# Patient Record
Sex: Female | Born: 1990 | Race: White | Hispanic: No | Marital: Single | Smoking: Current every day smoker
Health system: Southern US, Community
[De-identification: ages and names within clinical notes are randomized; demographics above are authoritative.]

## PROBLEM LIST (undated history)

## (undated) DIAGNOSIS — Z3490 Encounter for supervision of normal pregnancy, unspecified, unspecified trimester: Secondary | ICD-10-CM

## (undated) DIAGNOSIS — G8929 Other chronic pain: Secondary | ICD-10-CM

---
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
## 2011-02-16 ENCOUNTER — Emergency Department (HOSPITAL_COMMUNITY)
Admission: EM | Admit: 2011-02-16 | Discharge: 2011-02-17 | Disposition: A | Discharge status: Home / Self Care | Payer: Medicaid Other | Attending: Emergency Medicine | Admitting: Emergency Medicine

## 2011-02-16 DIAGNOSIS — R1031 Right lower quadrant pain: Secondary | ICD-10-CM | POA: Insufficient documentation

## 2011-02-16 DIAGNOSIS — R109 Unspecified abdominal pain: Secondary | ICD-10-CM | POA: Insufficient documentation

## 2011-02-16 DIAGNOSIS — N12 Tubulo-interstitial nephritis, not specified as acute or chronic: Secondary | ICD-10-CM | POA: Insufficient documentation

## 2011-02-16 DIAGNOSIS — R300 Dysuria: Secondary | ICD-10-CM | POA: Insufficient documentation

## 2011-02-16 DIAGNOSIS — R110 Nausea: Secondary | ICD-10-CM | POA: Insufficient documentation

## 2011-02-16 LAB — CBC
HCT: 38 % (ref 36.0–46.0)
Hemoglobin: 13.1 g/dL (ref 12.0–15.0)
MCH: 31.2 pg (ref 26.0–34.0)
MCHC: 34.5 g/dL (ref 30.0–36.0)
MCV: 90.5 fL (ref 78.0–100.0)
Platelets: 381 10*3/uL (ref 150–400)
RBC: 4.2 MIL/uL (ref 3.87–5.11)
RDW: 12.9 % (ref 11.5–15.5)
WBC: 26 10*3/uL — ABNORMAL HIGH (ref 4.0–10.5)

## 2011-02-16 LAB — POCT I-STAT, CHEM 8
BUN: 5 mg/dL — ABNORMAL LOW (ref 6–23)
Calcium, Ion: 1.15 mmol/L (ref 1.12–1.32)
Chloride: 97 mEq/L (ref 96–112)
Creatinine, Ser: 0.9 mg/dL (ref 0.50–1.10)
Glucose, Bld: 100 mg/dL — ABNORMAL HIGH (ref 70–99)
HCT: 41 % (ref 36.0–46.0)
Hemoglobin: 13.9 g/dL (ref 12.0–15.0)
Potassium: 3.2 mEq/L — ABNORMAL LOW (ref 3.5–5.1)
Sodium: 136 mEq/L (ref 135–145)
TCO2: 27 mmol/L (ref 0–100)

## 2011-02-16 LAB — DIFFERENTIAL
Basophils Absolute: 0 10*3/uL (ref 0.0–0.1)
Basophils Relative: 0 % (ref 0–1)
Eosinophils Absolute: 0.1 10*3/uL (ref 0.0–0.7)
Eosinophils Relative: 0 % (ref 0–5)
Lymphocytes Relative: 14 % (ref 12–46)
Lymphs Abs: 3.7 10*3/uL (ref 0.7–4.0)
Monocytes Absolute: 2.5 10*3/uL — ABNORMAL HIGH (ref 0.1–1.0)
Monocytes Relative: 10 % (ref 3–12)
Neutro Abs: 19.8 10*3/uL — ABNORMAL HIGH (ref 1.7–7.7)
Neutrophils Relative %: 76 % (ref 43–77)

## 2011-02-16 LAB — URINALYSIS, ROUTINE W REFLEX MICROSCOPIC
Bilirubin Urine: NEGATIVE
Glucose, UA: NEGATIVE mg/dL
Ketones, ur: NEGATIVE mg/dL
Nitrite: POSITIVE — AB
Protein, ur: 30 mg/dL — AB
Specific Gravity, Urine: 1.014 (ref 1.005–1.030)
Urobilinogen, UA: 4 mg/dL — ABNORMAL HIGH (ref 0.0–1.0)
pH: 6 (ref 5.0–8.0)

## 2011-02-16 LAB — URINE MICROSCOPIC-ADD ON

## 2011-02-16 LAB — POCT PREGNANCY, URINE: Preg Test, Ur: NEGATIVE

## 2011-02-17 ENCOUNTER — Emergency Department (HOSPITAL_COMMUNITY): Payer: Medicaid Other

## 2011-02-17 LAB — URINALYSIS, ROUTINE W REFLEX MICROSCOPIC
Glucose, UA: NEGATIVE mg/dL
Ketones, ur: NEGATIVE mg/dL
Nitrite: POSITIVE — AB
Protein, ur: 30 mg/dL — AB
Specific Gravity, Urine: 1.019 (ref 1.005–1.030)
Urobilinogen, UA: 4 mg/dL — ABNORMAL HIGH (ref 0.0–1.0)
pH: 6 (ref 5.0–8.0)

## 2011-02-17 LAB — WET PREP, GENITAL
Trich, Wet Prep: NONE SEEN
Yeast Wet Prep HPF POC: NONE SEEN

## 2011-02-17 LAB — URINE MICROSCOPIC-ADD ON

## 2011-02-19 LAB — GC/CHLAMYDIA PROBE AMP, GENITAL
Chlamydia, DNA Probe: NEGATIVE
GC Probe Amp, Genital: NEGATIVE

## 2012-02-14 ENCOUNTER — Emergency Department (HOSPITAL_COMMUNITY)
Admission: EM | Admit: 2012-02-14 | Discharge: 2012-02-14 | Discharge status: Against Medical Advice | Payer: Medicaid Other | Attending: Emergency Medicine | Admitting: Emergency Medicine

## 2012-02-14 ENCOUNTER — Encounter (HOSPITAL_COMMUNITY): Payer: Self-pay | Admitting: *Deleted

## 2012-02-14 DIAGNOSIS — Z0389 Encounter for observation for other suspected diseases and conditions ruled out: Secondary | ICD-10-CM | POA: Insufficient documentation

## 2012-02-14 HISTORY — DX: Encounter for supervision of normal pregnancy, unspecified, unspecified trimester: Z34.90

## 2012-02-14 NOTE — ED Notes (Addendum)
No answer in all waiting areas x 1 

## 2012-02-14 NOTE — ED Notes (Signed)
Pt is [redacted] weeks pregnant,  No bleeding. Low abd pain for 1 hour.  Vomiting since this am.

## 2012-02-14 NOTE — ED Notes (Signed)
No answer in waiting areas, called x 3 times

## 2013-12-27 ENCOUNTER — Encounter (HOSPITAL_COMMUNITY): Payer: Self-pay | Admitting: Emergency Medicine

## 2013-12-27 ENCOUNTER — Emergency Department (HOSPITAL_COMMUNITY)
Admission: EM | Admit: 2013-12-27 | Discharge: 2013-12-27 | Disposition: A | Discharge status: Home / Self Care | Payer: Medicaid Other | Attending: Emergency Medicine | Admitting: Emergency Medicine

## 2013-12-27 ENCOUNTER — Emergency Department (HOSPITAL_COMMUNITY): Payer: Medicaid Other

## 2013-12-27 DIAGNOSIS — R0789 Other chest pain: Secondary | ICD-10-CM

## 2013-12-27 DIAGNOSIS — R071 Chest pain on breathing: Secondary | ICD-10-CM | POA: Insufficient documentation

## 2013-12-27 DIAGNOSIS — Z880 Allergy status to penicillin: Secondary | ICD-10-CM | POA: Insufficient documentation

## 2013-12-27 DIAGNOSIS — F17200 Nicotine dependence, unspecified, uncomplicated: Secondary | ICD-10-CM | POA: Insufficient documentation

## 2013-12-27 MED ORDER — NAPROXEN 375 MG PO TABS
375.0000 mg | ORAL_TABLET | Freq: Two times a day (BID) | ORAL | Status: DC
Start: 2013-12-27 — End: 2015-02-17

## 2013-12-27 MED ORDER — CYCLOBENZAPRINE HCL 5 MG PO TABS: 5.0000 mg | ORAL_TABLET | Freq: Three times a day (TID) | ORAL | Status: DC | PRN

## 2013-12-27 NOTE — ED Provider Notes (Signed)
CSN: [id number]     Arrival date & time 12/27/13  2006 History   First MD Initiated Contact with Patient 12/27/13 2050     Chief Complaint  Patient presents with  . Neck Pain     (Consider location/radiation/quality/duration/timing/severity/associated sxs/prior Treatment) The history is provided by the patient.  Penny Douglas is a 23 y.o. female who presents to the ED with pain to the right upper chest wall that started a couple days ago. She states that there was a knot that has gone down some now but continues to be tender when anything touches it. She does not remember any injury to the area.    Past Medical History  Diagnosis Date  . Pregnant    History reviewed. No pertinent past surgical history. History reviewed. No pertinent family history. History  Substance Use Topics  . Smoking status: Current Every Day Smoker  . Smokeless tobacco: Not on file  . Alcohol Use: No   OB History   Grav Para Term Preterm Abortions TAB SAB Ect Mult Living   1              Review of Systems Chest wall pain.  All other systems negative   Allergies  Amoxicillin and Penicillins  Home Medications   Prior to Admission medications   Medication Sig Start Date End Date Taking? Authorizing Marcella Dunnaway  ibuprofen (ADVIL,MOTRIN) 200 MG tablet Take 200 mg by mouth every 6 (six) hours as needed.   Yes Historical Kelcy Baeten, MD   BP 104/68  Pulse 103  Temp(Src) 97.9 F (36.6 C)  Resp 20  Ht 5\' 3"  (1.6 m)  Wt 115 lb (52.164 kg)  BMI 20.38 kg/m2  SpO2 100%  LMP 11/26/2012  Breastfeeding? Unknown Physical Exam  Nursing note and vitals reviewed. Constitutional: She is oriented to person, place, and time. She appears well-developed and well-nourished. No distress.  HENT:  Head: Normocephalic.  Eyes: Conjunctivae and EOM are normal.  Neck: Neck supple.  Cardiovascular: Normal rate and regular rhythm.   Pulmonary/Chest: Effort normal and breath sounds normal. She exhibits tenderness. She  exhibits no mass, no crepitus, no deformity and no swelling.    Musculoskeletal: Normal range of motion.  Pain with palpation of right upper chest wall.   Neurological: She is alert and oriented to person, place, and time. No cranial nerve deficit.  Skin: Skin is warm and dry.  Psychiatric: She has a normal mood and affect. Her behavior is normal.    ED Course  Procedures Pain management, x-ray.  Dg Chest 2 View  12/27/2013   CLINICAL DATA:  Pain to upper anterior chest area.  EXAM: CHEST  2 VIEW  COMPARISON:  02/07/2011  FINDINGS: The heart size and mediastinal contours are within normal limits. Both lungs are clear. The visualized skeletal structures are unremarkable.  IMPRESSION: No active cardiopulmonary disease.   Electronically Signed   By: Signa Kellaylor  Stroud M.D.   On: 12/27/2013 22:11    MDM  23 y.o. female with pain to the right chest wall that has been ongoing for the past 2 days. Patient feeling better after pain medication. Sleeping in exam room. Stable for discharge without cardiac symptoms. Will treat for chest wall pain. She will return for worsening symptoms.    Medication List    TAKE these medications       cyclobenzaprine 5 MG tablet  Commonly known as:  FLEXERIL  Take 1 tablet (5 mg total) by mouth 3 (three) times daily as needed  for muscle spasms.     naproxen 375 MG tablet  Commonly known as:  NAPROSYN  Take 1 tablet (375 mg total) by mouth 2 (two) times daily.      ASK your doctor about these medications       ibuprofen 200 MG tablet  Commonly known as:  ADVIL,MOTRIN  Take 200 mg by mouth every 6 (six) hours as needed.         [facility] Orlene OchM [place], [place] 12/28/13 1536

## 2013-12-27 NOTE — ED Notes (Signed)
Bump to lower neck upper chest on left side, painful to touch.

## 2013-12-27 NOTE — Discharge Instructions (Signed)
Chest Wall Pain °Chest wall pain is pain felt in or around the chest bones and muscles. It may take up to 6 weeks to get better. It may take longer if you are active. Chest wall pain can happen on its own. Other times, things like germs, injury, coughing, or exercise can cause the pain. °HOME CARE  °· Avoid activities that make you tired or cause pain. Try not to use your chest, belly (abdominal), or side muscles. Do not use heavy weights. °· Put ice on the sore area. °¨ Put ice in a plastic bag. °¨ Place a towel between your skin and the bag. °¨ Leave the ice on for 15-20 minutes for the first 2 days. °· Only take medicine as told by your doctor. °GET HELP RIGHT AWAY IF:  °· You have more pain or are very uncomfortable. °· You have a fever. °· Your chest pain gets worse. °· You have new problems. °· You feel sick to your stomach (nauseous) or throw up (vomit). °· You start to sweat or feel lightheaded. °· You have a cough with mucus (phlegm). °· You cough up blood. °MAKE SURE YOU:  °· Understand these instructions. °· Will watch your condition. °· Will get help right away if you are not doing well or get worse. °Document Released: 12/05/2007 Document Revised: 09/10/2011 Document Reviewed: 02/12/2011 °Exit[employer]® Patient Information ©2015 [employer]. This information is not intended to replace advice given to you by your health care provider. Make sure you discuss any questions you have with your health care provider. ° °

## 2013-12-28 NOTE — ED Provider Notes (Signed)
Medical screening examination/treatment/procedure(s) were performed by non-physician practitioner and as supervising physician I was immediately available for consultation/collaboration.   EKG Interpretation None       Flint MelterElliott L Wentz, MD 12/28/13 [phone]

## 2014-05-03 ENCOUNTER — Encounter (HOSPITAL_COMMUNITY): Payer: Self-pay | Admitting: Emergency Medicine

## 2014-12-19 ENCOUNTER — Emergency Department (HOSPITAL_COMMUNITY)
Admission: EM | Admit: 2014-12-19 | Discharge: 2014-12-19 | Disposition: A | Discharge status: Home / Self Care | Payer: Medicaid Other | Attending: Emergency Medicine | Admitting: Emergency Medicine

## 2014-12-19 ENCOUNTER — Encounter (HOSPITAL_COMMUNITY): Payer: Self-pay | Admitting: *Deleted

## 2014-12-19 DIAGNOSIS — Z720 Tobacco use: Secondary | ICD-10-CM | POA: Diagnosis not present

## 2014-12-19 DIAGNOSIS — H9202 Otalgia, left ear: Secondary | ICD-10-CM | POA: Insufficient documentation

## 2014-12-19 DIAGNOSIS — Z791 Long term (current) use of non-steroidal anti-inflammatories (NSAID): Secondary | ICD-10-CM | POA: Insufficient documentation

## 2014-12-19 DIAGNOSIS — Z880 Allergy status to penicillin: Secondary | ICD-10-CM | POA: Insufficient documentation

## 2014-12-19 DIAGNOSIS — K029 Dental caries, unspecified: Secondary | ICD-10-CM

## 2014-12-19 MED ORDER — IBUPROFEN 400 MG PO TABS
600.0000 mg | ORAL_TABLET | Freq: Once | ORAL | Status: AC
  Administered 2014-12-19: 600 mg via ORAL
  Filled 2014-12-19: qty 2

## 2014-12-19 MED ORDER — CLINDAMYCIN HCL 150 MG PO CAPS
150.0000 mg | ORAL_CAPSULE | Freq: Once | ORAL | Status: AC
  Administered 2014-12-19: 150 mg via ORAL
  Filled 2014-12-19: qty 1

## 2014-12-19 MED ORDER — CLINDAMYCIN HCL 150 MG PO CAPS: 150.0000 mg | ORAL_CAPSULE | Freq: Four times a day (QID) | ORAL | Status: DC

## 2014-12-19 MED ORDER — ACETAMINOPHEN 500 MG PO TABS
1000.0000 mg | ORAL_TABLET | Freq: Once | ORAL | Status: AC
  Administered 2014-12-19: 1000 mg via ORAL
  Filled 2014-12-19: qty 2

## 2014-12-19 NOTE — ED Notes (Signed)
Pt c/o left side earache and toothache that started two weeks ago,

## 2014-12-19 NOTE — ED Provider Notes (Signed)
CSN: [id number]     Arrival date & time 12/19/14  0010 History  This chart was scribed for Devoria Albe, MD by Doreatha Martin, ED Scribe. This patient was seen in room APA18/APA18 and the patient's care was started at 12:22 AM.     Chief Complaint  Patient presents with  . Otalgia   The history is provided by the patient. No language interpreter was used.    HPI Comments: DONNICA TABOR is a 24 y.o. female who presents to the Emergency Department complaining of moderate, constant left-sided otalgia onset 1 week ago. She states associated ear drainage onset 3 days ago, difficulty sleeping and left lower dental pain onset 2 weeks ago. Pt states that she has a cracked tooth and it hurts to eat and drink. She reports that air hitting the tooth is painful, and cold food hurts more that hot food. She has tried Oragel with moderate relief. She has also tried Benadryl and 3, 500mg  Tylenol (take 1 TID) with no relief. Pt states the last time she went to the dentist was in middle school when she got her braces off. She has no PCP She is a current smoker (0.5 ppd). Pt is not currently working but was working at [employer] previously. She denies fevers.   PCP none Dentist none  Past Medical History  Diagnosis Date  . Pregnant    History reviewed. No pertinent past surgical history. No family history on file. History  Substance Use Topics  . Smoking status: Current Every Day Smoker  . Smokeless tobacco: Not on file  . Alcohol Use: No  smokes 1/2 ppd unemployed  OB History    Gravida Para Term Preterm AB TAB SAB Ectopic Multiple Living   1              IUD   Review of Systems  Constitutional: Negative for fever.  HENT: Positive for dental problem, ear discharge and ear pain.   All other systems reviewed and are negative.  Allergies  Amoxicillin and Penicillins  Home Medications   Prior to Admission medications   Medication Sig Start Date End Date Taking? Authorizing  Provider  clindamycin (CLEOCIN) 150 MG capsule Take 1 capsule (150 mg total) by mouth every 6 (six) hours. 12/19/14   Devoria Albe, MD  cyclobenzaprine (FLEXERIL) 5 MG tablet Take 1 tablet (5 mg total) by mouth 3 (three) times daily as needed for muscle spasms. 12/27/13   Hope Orlene Och, NP  ibuprofen (ADVIL,MOTRIN) 200 MG tablet Take 200 mg by mouth every 6 (six) hours as needed.    Historical Provider, MD  naproxen (NAPROSYN) 375 MG tablet Take 1 tablet (375 mg total) by mouth 2 (two) times daily. 12/27/13   Hope Orlene Och, NP   Triage VS: BP 100/63 mmHg  Pulse 65  Temp(Src) 97.9 F (36.6 C) (Oral)  Resp 20  Ht 5\' 2"  (1.575 m)  Wt 115 lb (52.164 kg)  BMI 21.03 kg/m2  SpO2 100%  Vital signs normal   Physical Exam  Constitutional: She is oriented to person, place, and time. She appears well-developed and well-nourished.  Non-toxic appearance. She does not appear ill. No distress.  HENT:  Head: Normocephalic and atraumatic.  Right Ear: Tympanic membrane, external ear and ear canal normal.  Left Ear: Tympanic membrane, external ear and ear canal normal.  Nose: Nose normal. No mucosal edema or rhinorrhea.  Mouth/Throat: Oropharynx is clear and moist and mucous membranes are normal. No dental abscesses  or uvula swelling.  Extensive dental caries along the lower gumline of all her bottom teeth. No focal swelling of the gums or abscess seen.    She c/o pain when I insert the speculum in her left ear canal but there is no swelling of the canal or redness and no drainage, the TM is intact.   Eyes: Conjunctivae and EOM are normal. Pupils are equal, round, and reactive to light.  Neck: Normal range of motion and full passive range of motion without pain. Neck supple.  Pulmonary/Chest: Effort normal. No respiratory distress. She has no rhonchi. She exhibits no crepitus.  Abdominal: Normal appearance.  Musculoskeletal: Normal range of motion.  Moves all extremities well.   Neurological: She is alert and  oriented to person, place, and time. She has normal strength. No cranial nerve deficit.  Skin: Skin is warm, dry and intact. No rash noted. No erythema. No pallor.  Psychiatric: She has a normal mood and affect. Her speech is normal and behavior is normal. Her mood appears not anxious.  Nursing note and vitals reviewed.   ED Course  Procedures (including critical care time) Medications  clindamycin (CLEOCIN) capsule 150 mg (150 mg Oral Given 12/19/14 0047)  acetaminophen (TYLENOL) tablet 1,000 mg (1,000 mg Oral Given 12/19/14 0047)  ibuprofen (ADVIL,MOTRIN) tablet 600 mg (600 mg Oral Given 12/19/14 0046)    DIAGNOSTIC STUDIES: Oxygen Saturation is 100% on RA, normal by my interpretation.    COORDINATION OF CARE: 12:27 AM Discussed treatment plan with pt at bedside and pt agreed to plan.   12:47 AM Provided resources for the health department and the free clinic. When I check Amion there is no dentist on call this weekend. Also I checked online for the [facility] free dental clinics and they do not have [place] or [facility] on their schedule for the rest of 2016. Pt used to see a dentist because she was wearing braces, her father reports she started with the dental decay when she was pregnant.   Labs Review Labs Reviewed - No data to display  Imaging Review No results found.   EKG Interpretation None     MDM   Final diagnoses:  Dental caries  Ear ache, left    Discharge Medication List as of 12/19/2014 12:42 AM    START taking these medications   Details  clindamycin (CLEOCIN) 150 MG capsule Take 1 capsule (150 mg total) by mouth every 6 (six) hours., Starting 12/19/2014, Until Discontinued, Print        Plan discharge  Devoria Albe, MD, FACEP   I personally performed the services described in this documentation, which was scribed in my presence. The recorded information has been reviewed and considered.  Devoria Albe, MD, Concha Pyo, MD 12/19/14 [id number]

## 2014-12-19 NOTE — Discharge Instructions (Signed)
You will need to see a dentist. I gave you two numbers to try for the free dental clinic in [facility]. Take ibuprofen 600 mg + acetaminophen 1000 mg 4 times a day for pain. Take the antibiotics until gone.  Return to the ED if you get a fever, otherwise you will need to see a dentist for definitive dental care.    Dental Caries Dental caries is tooth decay. This decay can cause a hole in teeth (cavity) that can get bigger and deeper over time. HOME CARE  Brush and floss your teeth. Do this at least two times a day.  Use a fluoride toothpaste.  Use a mouth rinse if told by your dentist or doctor.  Eat less sugary and starchy foods. Drink less sugary drinks.  Avoid snacking often on sugary and starchy foods. Avoid sipping often on sugary drinks.  Keep regular checkups and cleanings with your dentist.  Use fluoride supplements if told by your dentist or doctor.  Allow fluoride to be applied to teeth if told by your dentist or doctor. Document Released: 03/27/2008 Document Revised: 11/02/2013 Document Reviewed: 06/20/2012 [facility] Patient Information 2015 [place], [place]. This information is not intended to replace advice given to you by your health care provider. Make sure you discuss any questions you have with your health care provider.  Otalgia Otalgia is pain in or around the ear. When the pain is from the ear itself it is called primary otalgia. Pain may also be coming from somewhere else, like the head and neck. This is called secondary otalgia.  CAUSES  Causes of primary otalgia include:  Middle ear infection.  It can also be caused by injury to the ear or infection of the ear canal (swimmer's ear). Swimmer's ear causes pain, swelling and often drainage from the ear canal. Causes of secondary otalgia include:  Sinus infections.  Allergies and colds that cause stuffiness of the nose and tubes that drain the ears (eustachian tubes).  Dental problems like cavities, gum  infections or teething.  Sore Throat (tonsillitis and pharyngitis).  Swollen glands in the neck.  Infection of the bone behind the ear (mastoiditis).  TMJ discomfort (problems with the joint between your jaw and your skull).  Other problems such as nerve disorders, circulation problems, heart disease and tumors of the head and neck can also cause symptoms of ear pain. This is rare. DIAGNOSIS  Evaluation, Diagnosis and Testing:  Examination by your medical caregiver is recommended to evaluate and diagnose the cause of otalgia.  Further testing or referral to a specialist may be indicated if the cause of the ear pain is not found and the symptom persists. TREATMENT   Your doctor may prescribe antibiotics if an ear infection is diagnosed.  Pain relievers and topical analgesics may be recommended.  It is important to take all medications as prescribed. HOME CARE INSTRUCTIONS   It may be helpful to sleep with the painful ear in the up position.  A warm compress over the painful ear may provide relief.  A soft diet and avoiding gum may help while ear pain is present. SEEK IMMEDIATE MEDICAL CARE IF:  You develop severe pain, a high fever, repeated vomiting or dehydration.  You develop extreme dizziness, headache, confusion, ringing in the ears (tinnitus) or hearing loss. Document Released: 07/26/2004 Document Revised: 09/10/2011 Document Reviewed: 04/27/2009 [facility] Patient Information 2015 [place], [place]. This information is not intended to replace advice given to you by your health care provider. Make sure you  discuss any questions you have with your health care provider. ° °

## 2015-02-17 ENCOUNTER — Emergency Department (HOSPITAL_COMMUNITY)
Admission: EM | Admit: 2015-02-17 | Discharge: 2015-02-17 | Disposition: A | Discharge status: Home / Self Care | Payer: Medicaid Other | Attending: Physician Assistant | Admitting: Physician Assistant

## 2015-02-17 ENCOUNTER — Encounter (HOSPITAL_COMMUNITY): Payer: Self-pay

## 2015-02-17 DIAGNOSIS — N939 Abnormal uterine and vaginal bleeding, unspecified: Secondary | ICD-10-CM | POA: Insufficient documentation

## 2015-02-17 DIAGNOSIS — Z880 Allergy status to penicillin: Secondary | ICD-10-CM | POA: Diagnosis not present

## 2015-02-17 DIAGNOSIS — Z3202 Encounter for pregnancy test, result negative: Secondary | ICD-10-CM | POA: Diagnosis not present

## 2015-02-17 DIAGNOSIS — Z720 Tobacco use: Secondary | ICD-10-CM | POA: Insufficient documentation

## 2015-02-17 LAB — CBC WITH DIFFERENTIAL/PLATELET
Basophils Absolute: 0 10*3/uL (ref 0.0–0.1)
Basophils Relative: 0 % (ref 0–1)
Eosinophils Absolute: 0.2 10*3/uL (ref 0.0–0.7)
Eosinophils Relative: 2 % (ref 0–5)
HCT: 41.8 % (ref 36.0–46.0)
Hemoglobin: 13.9 g/dL (ref 12.0–15.0)
Lymphocytes Relative: 40 % (ref 12–46)
Lymphs Abs: 4.6 10*3/uL — ABNORMAL HIGH (ref 0.7–4.0)
MCH: 31.5 pg (ref 26.0–34.0)
MCHC: 33.3 g/dL (ref 30.0–36.0)
MCV: 94.8 fL (ref 78.0–100.0)
Monocytes Absolute: 0.8 10*3/uL (ref 0.1–1.0)
Monocytes Relative: 7 % (ref 3–12)
Neutro Abs: 5.9 10*3/uL (ref 1.7–7.7)
Neutrophils Relative %: 51 % (ref 43–77)
Platelets: 227 10*3/uL (ref 150–400)
RBC: 4.41 MIL/uL (ref 3.87–5.11)
RDW: 12.8 % (ref 11.5–15.5)
WBC: 11.4 10*3/uL — ABNORMAL HIGH (ref 4.0–10.5)

## 2015-02-17 LAB — WET PREP, GENITAL
Clue Cells Wet Prep HPF POC: NONE SEEN
Trich, Wet Prep: NONE SEEN
Yeast Wet Prep HPF POC: NONE SEEN

## 2015-02-17 LAB — BASIC METABOLIC PANEL
Anion gap: 9 (ref 5–15)
BUN: 14 mg/dL (ref 6–20)
CO2: 24 mmol/L (ref 22–32)
Calcium: 9.6 mg/dL (ref 8.9–10.3)
Chloride: 107 mmol/L (ref 101–111)
Creatinine, Ser: 0.88 mg/dL (ref 0.44–1.00)
GFR calc Af Amer: >60 mL/min (ref >60)
GFR calc non Af Amer: >60 mL/min (ref >60)
Glucose, Bld: 107 mg/dL — ABNORMAL HIGH (ref 65–99)
Potassium: 3.3 mmol/L — ABNORMAL LOW (ref 3.5–5.1)
Sodium: 140 mmol/L (ref 135–145)

## 2015-02-17 LAB — I-STAT BETA HCG BLOOD, ED (MC, WL, AP ONLY): I-stat hCG, quantitative: <5 m[IU]/mL (ref <5)

## 2015-02-17 MED ORDER — NAPROXEN 500 MG PO TABS: 500.0000 mg | ORAL_TABLET | Freq: Two times a day (BID) | ORAL | Status: DC

## 2015-02-17 MED ORDER — KETOROLAC TROMETHAMINE 60 MG/2ML IM SOLN
30.0000 mg | Freq: Once | INTRAMUSCULAR | Status: AC
  Administered 2015-02-17: 30 mg via INTRAMUSCULAR
  Filled 2015-02-17: qty 2

## 2015-02-17 NOTE — ED Provider Notes (Signed)
CSN: [id number]     Arrival date & time 02/17/15  2037 History   First MD Initiated Contact with Patient 02/17/15 2047     Chief Complaint  Patient presents with  . Vaginal Bleeding     (Consider location/radiation/quality/duration/timing/severity/associated sxs/prior Treatment) Patient is a 24 y.o. female presenting with vaginal bleeding. The history is provided by the patient. No language interpreter was used.  Vaginal Bleeding Quality:  Bright red Severity:  Moderate Onset quality:  Sudden Duration:  3 hours Progression:  Resolved Chronicity:  New Number of pads used:  No periods since IUD inserted 2 years ago until today Number of tampons used:  1 Possible pregnancy: no   Context: spontaneously   Relieved by:  None tried Worsened by:  Nothing tried Ineffective treatments:  None tried Associated symptoms: abdominal pain   Associated symptoms: no dizziness, no dyspareunia, no dysuria, no fever, no nausea and no vaginal discharge   Risk factors: unprotected sex    Penny Douglas is a 24 y.o. G1P1 who has IUD for birth control. She has done well with it for the past 2 years and has not had any bleeding until tonight. She reports sudden onset of heavy bleeding and lower abdominal cramping that started approximately 3 hours prior to arrival to the ED. She has taken nothing for pain. She filled one tampon and then the bleeding stopped but she continues to have cramping. Current sex partner x 6 years. Hx of Chlamydia but that was prior to this partner.   Past Medical History  Diagnosis Date  . Pregnant    History reviewed. No pertinent past surgical history. History reviewed. No pertinent family history. Social History  Substance Use Topics  . Smoking status: Current Every Day Smoker -- 1.00 packs/day    Types: Cigarettes  . Smokeless tobacco: None  . Alcohol Use: No   OB History    Gravida Para Term Preterm AB TAB SAB Ectopic Multiple Living   1              Review of  Systems  Constitutional: Negative for fever.  Gastrointestinal: Positive for abdominal pain. Negative for nausea.  Genitourinary: Positive for vaginal bleeding. Negative for dysuria, vaginal discharge and dyspareunia.  Neurological: Negative for dizziness.  all other systems negative    Allergies  Amoxicillin and Penicillins  Home Medications   Prior to Admission medications   Medication Sig Start Date End Date Taking? Authorizing Provider  naproxen (NAPROSYN) 500 MG tablet Take 1 tablet (500 mg total) by mouth 2 (two) times daily. 02/17/15   Hope Orlene Och, NP   BP 137/75 mmHg  Pulse 77  Temp(Src) 98.4 F (36.9 C) (Oral)  Resp 18  Ht  (1.575 m)  Wt 130 lb (58.968 kg)  BMI 23.77 kg/m2  SpO2 100% Physical Exam  Constitutional: She is oriented to person, place, and time. She appears well-developed and well-nourished. No distress.  HENT:  Head: Normocephalic.  Eyes: EOM are normal.  Neck: Neck supple.  Cardiovascular: Normal rate.   Pulmonary/Chest: Effort normal.  Abdominal: Soft. There is tenderness (mild). There is no CVA tenderness.  Lower abdominal tenderness that is mild without guarding or rebound.   Genitourinary:  External genitalia without lesions, scant blood vaginal vault. IUD string visible. No CMT, no adnexal tenderness or mass palpable. Uterus without palpable enlargement.   Musculoskeletal: Normal range of motion.  Neurological: She is alert and oriented to person, place, and time. No cranial nerve deficit.  Skin:  Skin is warm and dry.  Psychiatric: She has a normal mood and affect. Her behavior is normal.  Nursing note and vitals reviewed.   ED Course  Procedures (including critical care time) Labs Review Results for orders placed or performed during the hospital encounter of 02/17/15 (from the past 24 hour(s))  CBC with Differential/Platelet     Status: Abnormal   Collection Time: 02/17/15  8:59 PM  Result Value Ref Range   WBC 11.4 (H) 4.0 - 10.5  K/uL   RBC 4.41 3.87 - 5.11 MIL/uL   Hemoglobin 13.9 12.0 - 15.0 g/dL   HCT 16.1 09.6 - 04.5 %   MCV 94.8 78.0 - 100.0 fL   MCH 31.5 26.0 - 34.0 pg   MCHC 33.3 30.0 - 36.0 g/dL   RDW 40.9 81.1 - 91.4 %   Platelets 227 150 - 400 K/uL   Neutrophils Relative % 51 43 - 77 %   Neutro Abs 5.9 1.7 - 7.7 K/uL   Lymphocytes Relative 40 12 - 46 %   Lymphs Abs 4.6 (H) 0.7 - 4.0 K/uL   Monocytes Relative 7 3 - 12 %   Monocytes Absolute 0.8 0.1 - 1.0 K/uL   Eosinophils Relative 2 0 - 5 %   Eosinophils Absolute 0.2 0.0 - 0.7 K/uL   Basophils Relative 0 0 - 1 %   Basophils Absolute 0.0 0.0 - 0.1 K/uL  Basic metabolic panel     Status: Abnormal   Collection Time: 02/17/15  8:59 PM  Result Value Ref Range   Sodium 140 135 - 145 mmol/L   Potassium 3.3 (L) 3.5 - 5.1 mmol/L   Chloride 107 101 - 111 mmol/L   CO2 24 22 - 32 mmol/L   Glucose, Bld 107 (H) 65 - 99 mg/dL   BUN 14 6 - 20 mg/dL   Creatinine, Ser 7.82 0.44 - 1.00 mg/dL   Calcium 9.6 8.9 - 95.6 mg/dL   GFR calc non Af Amer >60 >60 mL/min   GFR calc Af Amer >60 >60 mL/min   Anion gap 9 5 - 15  I-Stat Beta hCG blood, ED ([facility], [facility], [facility] only)     Status: None   Collection Time: 02/17/15  9:02 PM  Result Value Ref Range   I-stat hCG, quantitative <5.0 <5 mIU/mL   Comment 3          Wet prep, genital     Status: Abnormal   Collection Time: 02/17/15  9:27 PM  Result Value Ref Range   Yeast Wet Prep HPF POC NONE SEEN NONE SEEN   Trich, Wet Prep NONE SEEN NONE SEEN   Clue Cells Wet Prep HPF POC NONE SEEN NONE SEEN   WBC, Wet Prep HPF POC FEW (A) NONE SEEN   GC, chlamydia pending  Toradol 30 mg IM  Imaging Review No results found. I have personally reviewed and evaluated the lab results as part of my medical decision-making.   MDM  24 y.o. female with sudden onset of vaginal bleeding and lower abdominal cramping that has resolved. Stable for d/c without bleeding and normal CBC. She will call her GYN in the morning for follow. Discussed  with the patient clinical and lab findings and plan of care. A. ll questioned fully answered.   Final diagnoses:  Abnormal vaginal bleeding      Janne Napoleon, NP 02/18/15 0140  Courteney Randall An, MD 02/22/15 1453

## 2015-02-17 NOTE — Discharge Instructions (Signed)
Call your GYN tomorrow for follow up  Abnormal Uterine Bleeding Abnormal uterine bleeding can affect women at various stages in life, including teenagers, women in their reproductive years, pregnant women, and women who have reached menopause. Several kinds of uterine bleeding are considered abnormal, including:  Bleeding or spotting between periods.   Bleeding after sexual intercourse.   Bleeding that is heavier or more than normal.   Periods that last longer than usual.  Bleeding after menopause.  Many cases of abnormal uterine bleeding are minor and simple to treat, while others are more serious. Any type of abnormal bleeding should be evaluated by your health care provider. Treatment will depend on the cause of the bleeding. HOME CARE INSTRUCTIONS Monitor your condition for any changes. The following actions may help to alleviate any discomfort you are experiencing:  Avoid the use of tampons and douches as directed by your health care provider.  Change your pads frequently. You should get regular pelvic exams and Pap tests. Keep all follow-up appointments for diagnostic tests as directed by your health care provider.  SEEK MEDICAL CARE IF:   Your bleeding lasts more than 1 week.   You feel dizzy at times.  SEEK IMMEDIATE MEDICAL CARE IF:   You pass out.   You are changing pads every 15 to 30 minutes.   You have abdominal pain.  You have a fever.   You become sweaty or weak.   You are passing large blood clots from the vagina.   You start to feel nauseous and vomit. MAKE SURE YOU:   Understand these instructions.  Will watch your condition.  Will get help right away if you are not doing well or get worse. Document Released: 06/18/2005 Document Revised: 06/23/2013 Document Reviewed: 01/15/2013 [facility] Patient Information 2015 [place], [place]. This information is not intended to replace advice given to you by your health care provider. Make sure you  discuss any questions you have with your health care provider.

## 2015-02-17 NOTE — ED Notes (Signed)
Patient states heavy vaginal bleeding with lower abdominal cramping. Patient states she has IUD in place X2 years

## 2015-02-21 LAB — GC/CHLAMYDIA PROBE AMP (~~LOC~~) NOT AT ARMC
Chlamydia: NEGATIVE
Neisseria Gonorrhea: NEGATIVE

## 2016-09-19 ENCOUNTER — Ambulatory Visit: Payer: Self-pay | Admitting: Family Medicine

## 2016-10-06 ENCOUNTER — Encounter (HOSPITAL_COMMUNITY): Payer: Self-pay | Admitting: Emergency Medicine

## 2016-10-06 ENCOUNTER — Emergency Department (HOSPITAL_COMMUNITY)
Admission: EM | Admit: 2016-10-06 | Discharge: 2016-10-06 | Disposition: A | Discharge status: Home / Self Care | Payer: Medicaid Other | Attending: Emergency Medicine | Admitting: Emergency Medicine

## 2016-10-06 DIAGNOSIS — K029 Dental caries, unspecified: Secondary | ICD-10-CM | POA: Insufficient documentation

## 2016-10-06 DIAGNOSIS — K009 Disorder of tooth development, unspecified: Secondary | ICD-10-CM

## 2016-10-06 DIAGNOSIS — F17210 Nicotine dependence, cigarettes, uncomplicated: Secondary | ICD-10-CM | POA: Insufficient documentation

## 2016-10-06 DIAGNOSIS — R220 Localized swelling, mass and lump, head: Secondary | ICD-10-CM | POA: Diagnosis present

## 2016-10-06 MED ORDER — DICLOFENAC SODIUM 75 MG PO TBEC: 75.0000 mg | DELAYED_RELEASE_TABLET | Freq: Two times a day (BID) | ORAL | 0 refills | Status: DC

## 2016-10-06 NOTE — ED Notes (Signed)
Pt prescribed clindamycin 300 mg by [facility]

## 2016-10-06 NOTE — ED Triage Notes (Signed)
Pt reports having severe dental pain and swelling for over a week.  States she is scheduled to have 11 teeth pulled, and is on abx.

## 2016-10-06 NOTE — Discharge Instructions (Signed)
You are allergic to penicillin; therefore, you should continue to take the same antibiotic. Prescription for pain medicine. Take with food. You can also take Tylenol with this prescription. Follow-up with the dentist.

## 2016-10-06 NOTE — ED Provider Notes (Signed)
[facility] Provider Note   CSN: [id number] Arrival date & time: 10/06/16  1728     History   Chief Complaint Chief Complaint  Patient presents with  . Oral Swelling    HPI Penny Douglas is a 26 y.o. female.  Patient has a known diagnosis of dental caries and missing teeth. She is scheduled to have 11 teeth pulled at the end of the month. She is currently on clindamycin. She has a penicillin allergy. She reports increased pain in her teeth. No fever, sweats, chills. She apparently took a Tylenol PM earlier today and is very sleepy.      Past Medical History:  Diagnosis Date  . Pregnant     There are no active problems to display for this patient.   History reviewed. No pertinent surgical history.  OB History    Gravida Para Term Preterm AB Living   1             SAB TAB Ectopic Multiple Live Births                   Home Medications    Prior to Admission medications   Medication Sig Start Date End Date Taking? Authorizing Provider  diclofenac (VOLTAREN) 75 MG EC tablet Take 1 tablet (75 mg total) by mouth 2 (two) times daily. 10/06/16   Donnetta Hutching, MD  naproxen (NAPROSYN) 500 MG tablet Take 1 tablet (500 mg total) by mouth 2 (two) times daily. 02/17/15   Hope Orlene Och, NP    Family History History reviewed. No pertinent family history.  Social History Social History  Substance Use Topics  . Smoking status: Current Every Day Smoker    Packs/day: 1.00    Types: Cigarettes  . Smokeless tobacco: Never Used  . Alcohol use No     Allergies   Amoxicillin and Penicillins   Review of Systems Review of Systems  All other systems reviewed and are negative.    Physical Exam Updated Vital Signs Ht  (1.626 m)   Wt 135 lb (61.2 kg)   LMP 10/03/2016   BMI 23.17 kg/m   Physical Exam  Constitutional: She is oriented to person, place, and time.  sleepy  HENT:  Head: Normocephalic and atraumatic.  Obvious dental caries and missing teeth.    Eyes: Conjunctivae are normal.  Neck: Neck supple.  Cardiovascular: Normal rate and regular rhythm.   Pulmonary/Chest: Effort normal and breath sounds normal.  Abdominal: Soft. Bowel sounds are normal.  Musculoskeletal: Normal range of motion.  Neurological: She is alert and oriented to person, place, and time.  Skin: Skin is warm and dry.  Psychiatric: She has a normal mood and affect. Her behavior is normal.  Nursing note and vitals reviewed.    ED Treatments / Results  Labs (all labs ordered are listed, but only abnormal results are displayed) Labs Reviewed - No data to display  EKG  EKG Interpretation None       Radiology No results found.  Procedures Procedures (including critical care time)  Medications Ordered in ED Medications - No data to display   Initial Impression / Assessment and Plan / ED Course  I have reviewed the triage vital signs and the nursing notes.  Pertinent labs & imaging results that were available during my care of the patient were reviewed by me and considered in my medical decision making (see chart for details).     No neurological deficits or stiff neck. Will Rx  Voltaren 75 mg twice a day.  Continue clindamycin. Follow-up with her dentist  Final Clinical Impressions(s) / ED Diagnoses   Final diagnoses:  Dental anomaly    New Prescriptions New Prescriptions   DICLOFENAC (VOLTAREN) 75 MG EC TABLET    Take 1 tablet (75 mg total) by mouth 2 (two) times daily.     Donnetta Hutching, MD 10/06/16 1900

## 2016-10-06 NOTE — ED Notes (Signed)
PT APPEARS IMPAIRED

## 2016-10-06 NOTE — ED Notes (Signed)
Pt reports that she is supposed to have 11 teeth pulled next week. She reports increased pain as well as swelling to her jaw- she is currently on antibiotics but reports she is allergic to penicillin and amoxicillin which makes her rash and swell

## 2016-10-06 NOTE — ED Notes (Signed)
Dr C in to assess 

## 2017-02-06 ENCOUNTER — Emergency Department (HOSPITAL_COMMUNITY)
Admission: EM | Admit: 2017-02-06 | Discharge: 2017-02-06 | Disposition: A | Discharge status: Home / Self Care | Payer: Medicaid Other | Attending: Emergency Medicine | Admitting: Emergency Medicine

## 2017-02-06 ENCOUNTER — Encounter (HOSPITAL_COMMUNITY): Payer: Self-pay | Admitting: Cardiology

## 2017-02-06 DIAGNOSIS — Z79899 Other long term (current) drug therapy: Secondary | ICD-10-CM | POA: Insufficient documentation

## 2017-02-06 DIAGNOSIS — A5901 Trichomonal vulvovaginitis: Secondary | ICD-10-CM | POA: Diagnosis not present

## 2017-02-06 DIAGNOSIS — R300 Dysuria: Secondary | ICD-10-CM | POA: Diagnosis present

## 2017-02-06 DIAGNOSIS — F17210 Nicotine dependence, cigarettes, uncomplicated: Secondary | ICD-10-CM | POA: Insufficient documentation

## 2017-02-06 LAB — WET PREP, GENITAL
Sperm: NONE SEEN
Yeast Wet Prep HPF POC: NONE SEEN

## 2017-02-06 LAB — URINALYSIS, ROUTINE W REFLEX MICROSCOPIC
Bilirubin Urine: NEGATIVE
Glucose, UA: NEGATIVE mg/dL
Hgb urine dipstick: NEGATIVE
Ketones, ur: NEGATIVE mg/dL
Nitrite: NEGATIVE
Protein, ur: NEGATIVE mg/dL
Specific Gravity, Urine: 1.001 — ABNORMAL LOW (ref 1.005–1.030)
pH: 6 (ref 5.0–8.0)

## 2017-02-06 MED ORDER — AZITHROMYCIN 250 MG PO TABS
ORAL_TABLET | ORAL | Status: AC
  Administered 2017-02-06: 17:00:00
  Filled 2017-02-06: qty 4

## 2017-02-06 MED ORDER — CEFTRIAXONE SODIUM 250 MG IJ SOLR
INTRAMUSCULAR | Status: AC
  Administered 2017-02-06: 18:00:00
  Filled 2017-02-06: qty 250

## 2017-02-06 MED ORDER — CEPHALEXIN 500 MG PO CAPS
ORAL_CAPSULE | ORAL | Status: AC
  Administered 2017-02-06: 17:00:00
  Filled 2017-02-06: qty 1

## 2017-02-06 MED ORDER — LIDOCAINE HCL (PF) 1 % IJ SOLN
INTRAMUSCULAR | Status: AC
  Administered 2017-02-06: 18:00:00
  Filled 2017-02-06: qty 5

## 2017-02-06 MED ORDER — ONDANSETRON HCL 4 MG PO TABS
ORAL_TABLET | ORAL | Status: AC
  Administered 2017-02-06: 18:00:00
  Filled 2017-02-06: qty 1

## 2017-02-06 NOTE — ED Notes (Signed)
Phlebotomy at bedside.

## 2017-02-06 NOTE — ED Provider Notes (Signed)
[facility] Provider Note   CSN: [id number] Arrival date & time: 02/06/17  1054     History   Chief Complaint Chief Complaint  Patient presents with  . Dysuria    HPI Penny Douglas is a 26 y.o. female.  Patient is a 26 year old female who presents to the emergency department with a complaint of dysuria.  The patient states that she has been having problems with urination over the past 2-3 weeks. She states that time she's had retention and unable to pass her urine. No reported vomiting. No high fever reported. The patient suffers from a low back pain issue. No new back pain issues. She states that she is seen at a clinic and treated with Suboxone. His been no recent loss of bowel or provider function. The patient is not had any frequent falls.   The history is provided by the patient.    Past Medical History:  Diagnosis Date  . Pregnant     There are no active problems to display for this patient.   History reviewed. No pertinent surgical history.  OB History    Gravida Para Term Preterm AB Living   1             SAB TAB Ectopic Multiple Live Births                   Home Medications    Prior to Admission medications   Medication Sig Start Date End Date Taking? Authorizing Provider  diclofenac (VOLTAREN) 75 MG EC tablet Take 1 tablet (75 mg total) by mouth 2 (two) times daily. 10/06/16   Donnetta Hutchingook, Brian, MD  naproxen (NAPROSYN) 500 MG tablet Take 1 tablet (500 mg total) by mouth 2 (two) times daily. 02/17/15   Janne NapoleonNeese, Hope M, NP    Family History History reviewed. No pertinent family history.  Social History Social History  Substance Use Topics  . Smoking status: Current Every Day Smoker    Packs/day: 1.00    Types: Cigarettes  . Smokeless tobacco: Never Used  . Alcohol use No     Allergies   Amoxicillin and Penicillins   Review of Systems Review of Systems  Constitutional: Negative for activity change.       All ROS Neg except as noted in HPI    HENT: Negative for nosebleeds.   Eyes: Negative for photophobia and discharge.  Respiratory: Negative for cough, shortness of breath and wheezing.   Cardiovascular: Negative for chest pain and palpitations.  Gastrointestinal: Negative for abdominal pain and blood in stool.  Genitourinary: Positive for dysuria and vaginal discharge. Negative for frequency, hematuria and vaginal bleeding.  Musculoskeletal: Positive for back pain. Negative for arthralgias and neck pain.  Skin: Negative.   Neurological: Negative for dizziness, seizures and speech difficulty.  Psychiatric/Behavioral: Negative for confusion and hallucinations.     Physical Exam Updated Vital Signs BP 99/70   Pulse 70   Temp 98.3 F (36.8 C) (Oral)   Resp 20   Ht 5\' 4"  (1.626 m)   Wt 54.4 kg (120 lb)   LMP 01/30/2017   SpO2 99%   BMI 20.60 kg/m   Physical Exam  Constitutional: She is oriented to person, place, and time. She appears well-developed and well-nourished.  Non-toxic appearance.  HENT:  Head: Normocephalic.  Right Ear: Tympanic membrane and external ear normal.  Left Ear: Tympanic membrane and external ear normal.  Eyes: Pupils are equal, round, and reactive to light. EOM and lids are normal.  Neck: Normal range of motion. Neck supple. Carotid bruit is not present.  Cardiovascular: Normal rate, regular rhythm, normal heart sounds, intact distal pulses and normal pulses.   Pulmonary/Chest: Breath sounds normal. No respiratory distress.  Abdominal: Soft. Bowel sounds are normal. There is no tenderness. There is no guarding.  Genitourinary:  Genitourinary Comments: Chaperone present during the examination.  Examination of the external structures is negative for any unusual rash or other acute abnormality. There is no foreign body noted in the vaginal vault on. There is a white discharge present no obvious odor present. The discharge was somewhat foamy. The os of the cervix is closed. There is some increased  redness or friability of the lower portion of the cervix at the 76 and 5:00 position. There was pain with movement of the cervix. There was some discomfort on at the right and left adnexal area. No mass appreciated.  Musculoskeletal: Normal range of motion.       Lumbar back: She exhibits pain.  Lymphadenopathy:       Head (right side): No submandibular adenopathy present.       Head (left side): No submandibular adenopathy present.    She has no cervical adenopathy.  Neurological: She is alert and oriented to person, place, and time. She has normal strength. No cranial nerve deficit or sensory deficit.  Skin: Skin is warm and dry.  Psychiatric: She has a normal mood and affect. Her speech is normal.  Nursing note and vitals reviewed.    ED Treatments / Results  Labs (all labs ordered are listed, but only abnormal results are displayed) Labs Reviewed  URINALYSIS, ROUTINE W REFLEX MICROSCOPIC - Abnormal; Notable for the following:       Result Value   Color, Urine STRAW (*)    APPearance HAZY (*)    Specific Gravity, Urine 1.001 (*)    Leukocytes, UA LARGE (*)    Bacteria, UA RARE (*)    Squamous Epithelial / LPF 0-5 (*)    All other components within normal limits    EKG  EKG Interpretation None       Radiology No results found.  Procedures Procedures (including critical care time)  Medications Ordered in ED Medications - No data to display   Initial Impression / Assessment and Plan / ED Course  I have reviewed the triage vital signs and the nursing notes.  Pertinent labs & imaging results that were available during my care of the patient were reviewed by me and considered in my medical decision making (see chart for details).       Final Clinical Impressions(s) / ED Diagnoses MDM Vital signs reviewed. Urinalysis obtained which shows a large leukocyte esterase, 0-5 rbc's and 0-5 WBCs with rare bacteria. Culture has been sent to the lab. Wet prep shows  Trichomonas to be present as well as clue cells, and many white blood cells. The patient was treated in the emergency department with intramuscular Rocephin and oral Zithromax. Prescription for Flagyl given to the patient. Patient given instructions to refrain from all sexual activity over the next 7 days. Patient knowledge is understanding of the results. She will follow-up at the health department if any changes or problems.    Final diagnoses:  Dysuria  Trichomonas vaginitis    New Prescriptions New Prescriptions   No medications on file     Duayne Cal 02/07/17 1021    Loren Racer, MD 02/10/17 [phone]

## 2017-02-06 NOTE — ED Triage Notes (Signed)
Difficulty urinating for 2-3 weeks. Lower back pain.

## 2017-02-06 NOTE — ED Notes (Signed)
Paper charting d/t downtime. Pt denies any reaction sx at this time. D/c instructions reviewed at this time. Ambulatory to d/c.

## 2017-02-07 LAB — GC/CHLAMYDIA PROBE AMP (~~LOC~~) NOT AT ARMC
Chlamydia: NEGATIVE
Neisseria Gonorrhea: NEGATIVE

## 2017-02-08 LAB — HIV ANTIBODY (ROUTINE TESTING W REFLEX): HIV Screen 4th Generation wRfx: NONREACTIVE

## 2017-02-08 LAB — RPR: RPR Ser Ql: NONREACTIVE

## 2017-08-12 ENCOUNTER — Encounter (HOSPITAL_COMMUNITY): Payer: Self-pay | Admitting: *Deleted

## 2017-08-12 ENCOUNTER — Emergency Department (HOSPITAL_COMMUNITY): Payer: Medicaid Other

## 2017-08-12 ENCOUNTER — Other Ambulatory Visit: Payer: Self-pay

## 2017-08-12 ENCOUNTER — Inpatient Hospital Stay (HOSPITAL_COMMUNITY)
Admission: EM | Admit: 2017-08-12 | Discharge: 2017-08-14 | DRG: 125 | Payer: Medicaid Other | Attending: Internal Medicine | Admitting: Internal Medicine

## 2017-08-12 DIAGNOSIS — F17210 Nicotine dependence, cigarettes, uncomplicated: Secondary | ICD-10-CM | POA: Diagnosis present

## 2017-08-12 DIAGNOSIS — L0390 Cellulitis, unspecified: Secondary | ICD-10-CM | POA: Diagnosis present

## 2017-08-12 DIAGNOSIS — Z880 Allergy status to penicillin: Secondary | ICD-10-CM

## 2017-08-12 DIAGNOSIS — R945 Abnormal results of liver function studies: Secondary | ICD-10-CM | POA: Diagnosis not present

## 2017-08-12 DIAGNOSIS — N898 Other specified noninflammatory disorders of vagina: Secondary | ICD-10-CM | POA: Diagnosis present

## 2017-08-12 DIAGNOSIS — L03213 Periorbital cellulitis: Secondary | ICD-10-CM

## 2017-08-12 DIAGNOSIS — Z23 Encounter for immunization: Secondary | ICD-10-CM

## 2017-08-12 DIAGNOSIS — R7989 Other specified abnormal findings of blood chemistry: Secondary | ICD-10-CM

## 2017-08-12 DIAGNOSIS — B182 Chronic viral hepatitis C: Secondary | ICD-10-CM | POA: Diagnosis present

## 2017-08-12 DIAGNOSIS — H00034 Abscess of left upper eyelid: Principal | ICD-10-CM | POA: Diagnosis present

## 2017-08-12 LAB — I-STAT BETA HCG BLOOD, ED (MC, WL, AP ONLY): I-stat hCG, quantitative: <5 m[IU]/mL (ref <5)

## 2017-08-12 LAB — CBC WITH DIFFERENTIAL/PLATELET
Basophils Absolute: 0 10*3/uL (ref 0.0–0.1)
Basophils Relative: 0 %
Eosinophils Absolute: 0.1 10*3/uL (ref 0.0–0.7)
Eosinophils Relative: 1 %
HCT: 38.9 % (ref 36.0–46.0)
Hemoglobin: 12.4 g/dL (ref 12.0–15.0)
Lymphocytes Relative: 39 %
Lymphs Abs: 2.5 10*3/uL (ref 0.7–4.0)
MCH: 30.8 pg (ref 26.0–34.0)
MCHC: 31.9 g/dL (ref 30.0–36.0)
MCV: 96.5 fL (ref 78.0–100.0)
Monocytes Absolute: 0.6 10*3/uL (ref 0.1–1.0)
Monocytes Relative: 10 %
Neutro Abs: 3.1 10*3/uL (ref 1.7–7.7)
Neutrophils Relative %: 50 %
Platelets: 180 10*3/uL (ref 150–400)
RBC: 4.03 MIL/uL (ref 3.87–5.11)
RDW: 13.3 % (ref 11.5–15.5)
WBC: 6.3 10*3/uL (ref 4.0–10.5)

## 2017-08-12 LAB — COMPREHENSIVE METABOLIC PANEL
ALT: 310 U/L — ABNORMAL HIGH (ref 14–54)
AST: 156 U/L — ABNORMAL HIGH (ref 15–41)
Albumin: 4 g/dL (ref 3.5–5.0)
Alkaline Phosphatase: 66 U/L (ref 38–126)
Anion gap: 8 (ref 5–15)
BUN: 13 mg/dL (ref 6–20)
CO2: 23 mmol/L (ref 22–32)
Calcium: 9.2 mg/dL (ref 8.9–10.3)
Chloride: 106 mmol/L (ref 101–111)
Creatinine, Ser: 0.86 mg/dL (ref 0.44–1.00)
GFR calc Af Amer: >60 mL/min (ref >60)
GFR calc non Af Amer: >60 mL/min (ref >60)
Glucose, Bld: 99 mg/dL (ref 65–99)
Potassium: 4.1 mmol/L (ref 3.5–5.1)
Sodium: 137 mmol/L (ref 135–145)
Total Bilirubin: 0.4 mg/dL (ref 0.3–1.2)
Total Protein: 7.8 g/dL (ref 6.5–8.1)

## 2017-08-12 MED ORDER — VANCOMYCIN HCL IN DEXTROSE 1-5 GM/200ML-% IV SOLN
1000.0000 mg | Freq: Once | INTRAVENOUS | Status: AC
  Administered 2017-08-12: 1000 mg via INTRAVENOUS
  Filled 2017-08-12: qty 200

## 2017-08-12 MED ORDER — VANCOMYCIN HCL IN DEXTROSE 1-5 GM/200ML-% IV SOLN
1000.0000 mg | Freq: Two times a day (BID) | INTRAVENOUS | Status: DC
Start: 2017-08-13 — End: 2017-08-14
  Administered 2017-08-13 – 2017-08-14 (×3): 1000 mg via INTRAVENOUS
  Filled 2017-08-12 (×3): qty 200

## 2017-08-12 MED ORDER — IOPAMIDOL (ISOVUE-300) INJECTION 61%
75.0000 mL | Freq: Once | INTRAVENOUS | Status: AC | PRN
  Administered 2017-08-12: 75 mL via INTRAVENOUS

## 2017-08-12 NOTE — H&P (Signed)
[facility] H&P    Patient Demographics:    Penny Douglas, is a 27 y.o. female  MRN: [id number]  DOB - 08/30/1990  Admit Date - 08/12/2017  Referring MD/NP/PA:  Ranae Palms  Outpatient Primary MD for the patient is [facility]  Patient coming from: [place]  Chief complaint-left upper eyelid swelling   HPI:    Penny Douglas  is a 27 y.o. female, who [facility],  Anemia who came to hospital with worsening left upper eyelid swelling. Patient was prescribed Bactrim as outpatient but did not get improved. Symptoms have been there for past five days. She denies photophobia or visual changes. No pain on movement of the eyes. Complains of intermittent tingling in bilateral upper extremities. CT scan maxillofacial showed left super orbital subcutaneous soft tissue swelling and induration suggesting cellulitis. No orbital involvement. No abscess or drainable fluid collection. She denies chest pain or shortness of breath.  Denies dysuria   Review of systems:      All other systems reviewed and are negative.   With Past History of the following :    Past Medical History:  Diagnosis Date  . Pregnant          Social History:      Social History   Tobacco Use  . Smoking status: Current Every Day Smoker    Packs/day: 1.00    Types: Cigarettes  . Smokeless tobacco: Never Used  Substance Use Topics  . Alcohol use: No       Family History :   No family history of cancer   Home Medications:   Prior to Admission medications   Medication Sig Start Date End Date Taking? Authorizing Provider  sulfamethoxazole-trimethoprim (BACTRIM DS,SEPTRA DS) 800-160 MG tablet Take 1 tablet by mouth 2 (two) times daily.   Yes [provider]     Allergies:     Allergies  Allergen Reactions  . Amoxicillin     unknown  . Penicillins Itching, Swelling and Rash    Has  patient had a PCN reaction causing immediate rash, facial/tongue/throat swelling, SOB or lightheadedness with hypotension: Yes Has patient had a PCN reaction causing severe rash involving mucus membranes or skin necrosis: No Has patient had a PCN reaction that required hospitalization: No Has patient had a PCN reaction occurring within the last 10 years: No If all of the above answers are "NO", then may proceed with Cephalosporin use.      Physical Exam:   Vitals  Blood pressure 121/75, pulse 70, temperature 98.1 F (36.7 C), temperature source Oral, resp. rate 16, height 5\' 3"  (1.6 m), weight 61.2 kg (135 lb), last menstrual period 08/02/2017, SpO2 100 %, unknown if currently breastfeeding.  1.  General: appears in no acute distress  2. Psychiatric:  Intact judgement and  insight, awake alert, oriented x 3.  3. Neurologic: No focal neurological deficits, all cranial nerves intact.Strength 5/5 all 4 extremities, sensation intact all 4 extremities, plantars down going.  4. Eyes :  anicteric sclerae, moist conjunctivae with no  lid lag. PERRLA.  5. ENMT:  Oropharynx clear with moist mucous membranes and good dentition  6. Neck:  supple, no cervical lymphadenopathy appriciated, No thyromegaly  7. Respiratory : Normal respiratory effort, good air movement bilaterally,clear to  auscultation bilaterally  8. Cardiovascular : RRR, no gallops, rubs or murmurs, no leg edema  9. Gastrointestinal:  Positive bowel sounds, abdomen soft, non-tender to palpation,no hepatosplenomegaly, no rigidity or guarding       10. Skin:  No cyanosis, normal texture and turgor, no rash, lesions or ulcers  11.Musculoskeletal:  Good muscle tone,  joints appear normal , no effusions,  normal range of motion    Data Review:    CBC Recent Labs  Lab 08/12/17 2019  WBC 6.3  HGB 12.4  HCT 38.9  PLT 180  MCV 96.5  MCH 30.8  MCHC 31.9  RDW 13.3  LYMPHSABS 2.5  MONOABS 0.6  EOSABS 0.1    BASOSABS 0.0   ------------------------------------------------------------------------------------------------------------------  Chemistries  Recent Labs  Lab 08/12/17 2019  NA 137  K 4.1  CL 106  CO2 23  GLUCOSE 99  BUN 13  CREATININE 0.86  CALCIUM 9.2  AST 156*  ALT 310*  ALKPHOS 66  BILITOT 0.4   ------------------------------------------------------------------------------------------------------------------  ------------------------------------------------------------------------------------------------------------------ GFR: Estimated Creatinine Clearance: 82.7 mL/min (by C-G formula based on SCr of 0.86 mg/dL). Liver Function Tests: Recent Labs  Lab 08/12/17 2019  AST 156*  ALT 310*  ALKPHOS 66  BILITOT 0.4  PROT 7.8  ALBUMIN 4.0    --------------------------------------------------------------------------------------------------------------- Urine analysis:    Component Value Date/Time   COLORURINE STRAW (A) 02/06/2017 1157   APPEARANCEUR HAZY (A) 02/06/2017 1157   LABSPEC 1.001 (L) 02/06/2017 1157   PHURINE 6.0 02/06/2017 1157   GLUCOSEU NEGATIVE 02/06/2017 1157   HGBUR NEGATIVE 02/06/2017 1157   BILIRUBINUR NEGATIVE 02/06/2017 1157   KETONESUR NEGATIVE 02/06/2017 1157   PROTEINUR NEGATIVE 02/06/2017 1157   UROBILINOGEN 4.0 (H) 02/16/2011 2333   NITRITE NEGATIVE 02/06/2017 1157   LEUKOCYTESUR LARGE (A) 02/06/2017 1157      Imaging Results:    Ct Maxillofacial W Contrast  Result Date: 08/12/2017 CLINICAL DATA:  Left eye swelling EXAM: CT MAXILLOFACIAL WITH CONTRAST TECHNIQUE: Multidetector CT imaging of the maxillofacial structures was performed with intravenous contrast. Multiplanar CT image reconstructions were also generated. CONTRAST:  75mL ISOVUE-300 IOPAMIDOL (ISOVUE-300) INJECTION 61% COMPARISON:  None. FINDINGS: Osseous: No facial fracture or focal osseous abnormality. Orbits: There is swelling and induration of the subcutaneous tissues  in the left supraorbital zone. No fluid collection. There is no extension into the orbit. The intraorbital structures are normal. The ocular globes are normal. Sinuses: Clear. Soft tissues: Negative. Limited intracranial: No significant or unexpected finding. IMPRESSION: Left supraorbital subcutaneous soft tissue swelling and induration suggesting cellulitis. No orbital involvement. No abscess or drainable fluid collection. Electronically Signed   By: Deatra RobinsonKevin  Herman M.D.   On: 08/12/2017 21:29     Assessment & Plan:    Active Problems:   Cellulitis   1. Cellulitis-CT scan shows left supraorbital subcutaneous soft tissue swelling and induration suggesting cellulitis. She has failed outpatient treatment with Bactrim. Will start vancomycin per pharmacy consultation. Also start ibuprofen 600 mg PO Q6 hours. 2. Transaminates-patient has elevated AST 156 and ALT 310, hepatitis panel has been sent. Follow the results.   DVT Prophylaxis-   Lovenox   AM Labs Ordered, also please review Full Orders  Family Communication: Admission, patients condition and plan of care including tests being ordered have been  discussed with the patient  who indicate understanding and agree with the plan and Code Status.  Code Status: full code  Admission status: observation  Time spent in minutes : 60 minutes   Meredeth Ide M.D on 08/12/2017 at 11:10 PM  Between 7am to 7pm - Pager - [phone]. After 7pm go to [email or web address] - password [facility]  [facility] - Office  [phone]

## 2017-08-12 NOTE — ED Triage Notes (Addendum)
[facility] seen by [facility] nurse for left eye swelling that began on Wednesday, antibiotic therapy began on same day, swelling, redness with temperature, began to worsen.  Jail nurse advised patient to be seen in ED. Patient also states she wants to be treated for Gonorrhea.

## 2017-08-12 NOTE — ED Provider Notes (Signed)
[facility] EMERGENCY DEPARTMENT Provider Note   CSN: [id number] Arrival date & time: 08/12/17  1730     History   Chief Complaint Chief Complaint  Patient presents with  . Eye Problem    HPI Penny Douglas is a 27 y.o. female.  HPI Patient with swelling and redness to the periorbital region around the left eye.  This been present for 5 days.  She is been on Bactrim without improvement.  States the swelling is worse in the mornings.  She denies any photophobia or visual changes.  No pain with range of motion of the eyes.  No discharge.  Denies any fever or chills.  States she has had close to 1 month of diarrhea.  Describes as watery.  She has intermittent tingling in her bilateral upper extremities. Past Medical History:  Diagnosis Date  . Pregnant     There are no active problems to display for this patient.   No past surgical history on file.  OB History    Gravida Para Term Preterm AB Living   1             SAB TAB Ectopic Multiple Live Births                   Home Medications    Prior to Admission medications   Medication Sig Start Date End Date Taking? Authorizing Provider  sulfamethoxazole-trimethoprim (BACTRIM DS,SEPTRA DS) 800-160 MG tablet Take 1 tablet by mouth 2 (two) times daily.   Yes [provider]    Family History No family history on file.  Social History Social History   Tobacco Use  . Smoking status: Current Every Day Smoker    Packs/day: 1.00    Types: Cigarettes  . Smokeless tobacco: Never Used  Substance Use Topics  . Alcohol use: No  . Drug use: No     Allergies   Amoxicillin and Penicillins   Review of Systems Review of Systems  Constitutional: Negative for chills and fever.  HENT: Negative for congestion and sinus pressure.   Eyes: Negative for pain, discharge, redness and visual disturbance.  Respiratory: Negative for cough and shortness of breath.   Cardiovascular: Negative for chest pain, palpitations  and leg swelling.  Gastrointestinal: Positive for diarrhea. Negative for abdominal pain, constipation, nausea and vomiting.  Musculoskeletal: Negative for back pain, myalgias and neck pain.  Skin: Negative for rash and wound.  Neurological: Negative for dizziness, syncope, weakness, light-headedness, numbness and headaches.  All other systems reviewed and are negative.    Physical Exam Updated Vital Signs BP 121/75 (BP Location: Right Arm)   Pulse 63   Temp 98.1 F (36.7 C) (Oral)   Resp 16   Ht 5\' 3"  (1.6 m)   Wt 61.2 kg (135 lb)   LMP 08/02/2017   SpO2 98%   BMI 23.91 kg/m   Physical Exam  Constitutional: She is oriented to person, place, and time. She appears well-developed and well-nourished.  HENT:  Head: Normocephalic and atraumatic.  Mouth/Throat: Oropharynx is clear and moist.  Patient has swelling and redness located of the left lateral eyebrow.  Appears to be indurated and mild fluctuance.  Redness extends inferior around the left eye.  Oropharynx is clear.  Eyes: EOM are normal. Pupils are equal, round, and reactive to light.  No conjunctival injection.  No discharge from the eyes.  Extraocular motion intact without pain.  Neck: Normal range of motion. Neck supple.  Cardiovascular: Normal rate and regular  rhythm. Exam reveals no gallop and no friction rub.  No murmur heard. Pulmonary/Chest: Effort normal and breath sounds normal. No stridor. No respiratory distress. She has no wheezes. She has no rales. She exhibits no tenderness.  Abdominal: Soft. Bowel sounds are normal. There is no tenderness. There is no rebound and no guarding.  Musculoskeletal: Normal range of motion. She exhibits no edema or tenderness.  Distal pulses intact.  No lower extremity swelling or asymmetry.  No CVA tenderness bilaterally.  Lymphadenopathy:    She has cervical adenopathy.  Neurological: She is alert and oriented to person, place, and time.  Moves all extremities without deficit.   Sensation fully intact.  Skin: Skin is warm and dry. Capillary refill takes less than 2 seconds. No rash noted. No erythema.  Psychiatric: She has a normal mood and affect. Her behavior is normal.  Nursing note and vitals reviewed.    ED Treatments / Results  Labs (all labs ordered are listed, but only abnormal results are displayed) Labs Reviewed  COMPREHENSIVE METABOLIC PANEL - Abnormal; Notable for the following components:      Result Value   AST 156 (*)    ALT 310 (*)    All other components within normal limits  CBC WITH DIFFERENTIAL/PLATELET  HEPATITIS PANEL, ACUTE  I-STAT BETA HCG BLOOD, ED ([facility], [facility], [facility] ONLY)    EKG  EKG Interpretation None       Radiology Ct Maxillofacial W Contrast  Result Date: 08/12/2017 CLINICAL DATA:  Left eye swelling EXAM: CT MAXILLOFACIAL WITH CONTRAST TECHNIQUE: Multidetector CT imaging of the maxillofacial structures was performed with intravenous contrast. Multiplanar CT image reconstructions were also generated. CONTRAST:  75mL ISOVUE-300 IOPAMIDOL (ISOVUE-300) INJECTION 61% COMPARISON:  None. FINDINGS: Osseous: No facial fracture or focal osseous abnormality. Orbits: There is swelling and induration of the subcutaneous tissues in the left supraorbital zone. No fluid collection. There is no extension into the orbit. The intraorbital structures are normal. The ocular globes are normal. Sinuses: Clear. Soft tissues: Negative. Limited intracranial: No significant or unexpected finding. IMPRESSION: Left supraorbital subcutaneous soft tissue swelling and induration suggesting cellulitis. No orbital involvement. No abscess or drainable fluid collection. Electronically Signed   By: Deatra RobinsonKevin  Herman M.D.   On: 08/12/2017 21:29    Procedures Procedures (including critical care time)  Medications Ordered in ED Medications  vancomycin (VANCOCIN) IVPB 1000 mg/200 mL premix (1,000 mg Intravenous New Bag/Given 08/12/17 2037)  iopamidol (ISOVUE-300) 61 %  injection 75 mL (75 mLs Intravenous Contrast Given 08/12/17 2046)     Initial Impression / Assessment and Plan / ED Course  I have reviewed the triage vital signs and the nursing notes.  Pertinent labs & imaging results that were available during my care of the patient were reviewed by me and considered in my medical decision making (see chart for details).     Evidence of abscess or orbital cellulitis on CT.  Patient does have abnormally elevated liver enzymes.  With history of diarrhea question hepatitis.  Hepatitis panel has been sent.  Discussed with hospitalist who will see patient in emergency department and admit.  Final Clinical Impressions(s) / ED Diagnoses   Final diagnoses:  Preseptal cellulitis of left eye  Abnormal LFTs    ED Discharge Orders    None       Loren RacerYelverton, Daric Koren, MD 08/12/17 2211

## 2017-08-12 NOTE — Progress Notes (Addendum)
ANTIBIOTIC CONSULT NOTE-Preliminary  Pharmacy Consult for Vancomycin Indication: cellulitis (left upper eyelid)  Allergies  Allergen Reactions  . Amoxicillin     unknown  . Penicillins Itching, Swelling and Rash    Has patient had a PCN reaction causing immediate rash, facial/tongue/throat swelling, SOB or lightheadedness with hypotension: Yes Has patient had a PCN reaction causing severe rash involving mucus membranes or skin necrosis: No Has patient had a PCN reaction that required hospitalization: No Has patient had a PCN reaction occurring within the last 10 years: No If all of the above answers are "NO", then may proceed with Cephalosporin use.     Patient Measurements: Height: 5\' 3"  (160 cm) Weight: 135 lb (61.2 kg) IBW/kg (Calculated) : 52.4 kg   Vital Signs: Temp: 98.1 F (36.7 C) (02/11 1735) Temp Source: Oral (02/11 1735) BP: 121/75 (02/11 1735) Pulse Rate: 70 (02/11 2200)  Labs: Recent Labs    08/12/17 2019  WBC 6.3  HGB 12.4  PLT 180  CREATININE 0.86    Estimated Creatinine Clearance: 82.7 mL/min (by C-G formula based on SCr of 0.86 mg/dL).     Microbiology: No results found for this or any previous visit (from the past 720 hour(s)).  Medical History: Past Medical History:  Diagnosis Date  . Pregnant   hCG <5; pt is not currently pregnant  Medications:  Bactrim as outpatient - failed treatment  Assessment: 27 yr old nonpregnant female with cellulitis of left upper eyelid who failed outpatient treatment with bactrim.   CT scan maxillofacial showed left super orbital subcutaneous soft tissue swelling and induration suggesting cellulitis. No orbital involvement. No abscess or drainable fluid collection.  Goal of Therapy:  Vancomycin trough level 10-15 mcg/ml  Plan:  Preliminary review of pertinent patient information completed.  Protocol will be initiated with Vancomycin 1 gram IV Q12.   Vancomycin trough level at steady state if needed;  monitor renal function. Jeani HawkingAnnie Penn clinical pharmacist will complete review during morning rounds to assess patient and finalize treatment regimen if needed.  Natasha BenceCline, Kayleann Mccaffery, [facility] 08/12/2017,11:31 PM

## 2017-08-13 ENCOUNTER — Other Ambulatory Visit: Payer: Self-pay

## 2017-08-13 DIAGNOSIS — L03213 Periorbital cellulitis: Secondary | ICD-10-CM | POA: Diagnosis not present

## 2017-08-13 DIAGNOSIS — R945 Abnormal results of liver function studies: Secondary | ICD-10-CM | POA: Diagnosis not present

## 2017-08-13 DIAGNOSIS — R7989 Other specified abnormal findings of blood chemistry: Secondary | ICD-10-CM

## 2017-08-13 LAB — CBC
HCT: 37.9 % (ref 36.0–46.0)
Hemoglobin: 11.9 g/dL — ABNORMAL LOW (ref 12.0–15.0)
MCH: 30.2 pg (ref 26.0–34.0)
MCHC: 31.4 g/dL (ref 30.0–36.0)
MCV: 96.2 fL (ref 78.0–100.0)
Platelets: 194 10*3/uL (ref 150–400)
RBC: 3.94 MIL/uL (ref 3.87–5.11)
RDW: 13.3 % (ref 11.5–15.5)
WBC: 6.4 10*3/uL (ref 4.0–10.5)

## 2017-08-13 LAB — COMPREHENSIVE METABOLIC PANEL
ALT: 306 U/L — ABNORMAL HIGH (ref 14–54)
ALT: 327 U/L — ABNORMAL HIGH (ref 14–54)
AST: 164 U/L — ABNORMAL HIGH (ref 15–41)
AST: 165 U/L — ABNORMAL HIGH (ref 15–41)
Albumin: 3.7 g/dL (ref 3.5–5.0)
Albumin: 3.8 g/dL (ref 3.5–5.0)
Alkaline Phosphatase: 60 U/L (ref 38–126)
Alkaline Phosphatase: 64 U/L (ref 38–126)
Anion gap: 9 (ref 5–15)
Anion gap: 7 (ref 5–15)
BUN: 11 mg/dL (ref 6–20)
BUN: 10 mg/dL (ref 6–20)
CO2: 23 mmol/L (ref 22–32)
CO2: 25 mmol/L (ref 22–32)
Calcium: 9.2 mg/dL (ref 8.9–10.3)
Calcium: 9.2 mg/dL (ref 8.9–10.3)
Chloride: 107 mmol/L (ref 101–111)
Chloride: 106 mmol/L (ref 101–111)
Creatinine, Ser: 0.84 mg/dL (ref 0.44–1.00)
Creatinine, Ser: 0.88 mg/dL (ref 0.44–1.00)
GFR calc Af Amer: >60 mL/min (ref >60)
GFR calc Af Amer: >60 mL/min (ref >60)
GFR calc non Af Amer: >60 mL/min (ref >60)
GFR calc non Af Amer: >60 mL/min (ref >60)
Glucose, Bld: 103 mg/dL — ABNORMAL HIGH (ref 65–99)
Glucose, Bld: 95 mg/dL (ref 65–99)
Potassium: 4.2 mmol/L (ref 3.5–5.1)
Potassium: 4.5 mmol/L (ref 3.5–5.1)
Sodium: 139 mmol/L (ref 135–145)
Sodium: 138 mmol/L (ref 135–145)
Total Bilirubin: 0.6 mg/dL (ref 0.3–1.2)
Total Bilirubin: 0.4 mg/dL (ref 0.3–1.2)
Total Protein: 7.2 g/dL (ref 6.5–8.1)
Total Protein: 7.5 g/dL (ref 6.5–8.1)

## 2017-08-13 LAB — MRSA PCR SCREENING: MRSA by PCR: NEGATIVE

## 2017-08-13 MED ORDER — IBUPROFEN 600 MG PO TABS
600.0000 mg | ORAL_TABLET | Freq: Four times a day (QID) | ORAL | Status: DC
  Administered 2017-08-13 – 2017-08-14 (×8): 600 mg via ORAL
  Filled 2017-08-13 (×8): qty 1

## 2017-08-13 MED ORDER — SODIUM CHLORIDE 0.9 % IV SOLN: INTRAVENOUS | Status: DC

## 2017-08-13 MED ORDER — ENOXAPARIN SODIUM 40 MG/0.4ML ~~LOC~~ SOLN
40.0000 mg | SUBCUTANEOUS | Status: DC
  Filled 2017-08-13: qty 0.4

## 2017-08-13 MED ORDER — AZITHROMYCIN 1 G PO PACK
1.0000 g | PACK | Freq: Once | ORAL | Status: DC
  Filled 2017-08-13: qty 1

## 2017-08-13 MED ORDER — PNEUMOCOCCAL VAC POLYVALENT 25 MCG/0.5ML IJ INJ
0.5000 mL | INJECTION | INTRAMUSCULAR | Status: AC
  Administered 2017-08-14: 0.5 mL via INTRAMUSCULAR
  Filled 2017-08-13: qty 0.5

## 2017-08-13 MED ORDER — INFLUENZA VAC SPLIT QUAD 0.5 ML IM SUSY
0.5000 mL | PREFILLED_SYRINGE | INTRAMUSCULAR | Status: AC
  Administered 2017-08-14: 0.5 mL via INTRAMUSCULAR
  Filled 2017-08-13: qty 0.5

## 2017-08-13 MED ORDER — AZITHROMYCIN 250 MG PO TABS
1000.0000 mg | ORAL_TABLET | Freq: Once | ORAL | Status: AC
  Administered 2017-08-13: 1000 mg via ORAL
  Filled 2017-08-13: qty 4

## 2017-08-13 NOTE — Progress Notes (Signed)
Patient states that sometime before January 15,2019, before in jail, she was diagnosed with Gonorrhea by a doctor. Patient states that she did not see that doctor again, therefore was not treated for the Gonorrhea. Patient asking to be treated for this now. Patient states that she does have green vaginal discharge.

## 2017-08-13 NOTE — Plan of Care (Signed)
  Education: Knowledge of General Education information will improve 08/13/2017 2241 - Progressing by Wynne Dusthomas, Joylynn Defrancesco, RN

## 2017-08-13 NOTE — Progress Notes (Signed)
PROGRESS NOTE    Penny Douglas  YQM:[id number] DOB: 03/24/1991 DOA: 08/12/2017 [facility]    Brief Narrative: 27 year old female brought to the hospital from correctional facility with cellulitis around her left eye.  It did not lead to any vision changes.  Started on intravenous vancomycin with improvement.  She is also noted to have elevated liver function test.  She has a history of drug use.  Hepatitis panel in process.  If hepatitis panel is negative, will need to consider GI evaluation.  Overall cellulitis is improving.   Assessment & Plan:   Active Problems:   Cellulitis   Elevated LFTs   1. Cellulitis.  Patient noted to have cellulitis above left eye.  She was prescribed Bactrim as an outpatient and took this for 3-4 days without significant improvement.  CT scan did not show any orbital involvement.  There was no clear collection to drain.  She was started on intravenous vancomycin.  Clinically, she has improved.  Cellulitis appears to be coalescing into collection above left eye.  Will place warm compresses.  This may start spontaneously draining. 2. Elevated liver function test.  Patient reports being told approximately 2 years ago that she did have elevated liver enzymes.  She does not have any abdominal pain, nausea or vomiting.  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  Hepatitis panel is in process. 3. Possible gonorrhea.  Patient reports that last month she had seen a physician and was told that she had gonorrhea.  She did not receive any specific treatment for this.  She continues to have green colored vaginal discharge.  We will treat her with a one-time dose of azithromycin.  Ceftriaxone injection was also considered, but she has a severe penicillin allergy which induces anaphylaxis.  She reports in the past she is been treated with azithromycin alone.     DVT prophylaxis: Lovenox Code Status: Full code Family Communication: No family  present Disposition Plan: Discharge to correctional facility when she has improved   Consultants:     Procedures:     Antimicrobials:   Vancomycin 2/11 >   Subjective: Feels overall swelling is improving.  Pain is also better.  Objective: Vitals:   08/12/17 2200 08/12/17 2354 08/13/17 0030 08/13/17 0559  BP:  109/76 113/62 108/89  Pulse: 70 (!) 106 71 83  Resp:  18 18 20   Temp:   98.2 F (36.8 C) 97.8 F (36.6 C)  TempSrc:   Oral Oral  SpO2: 100% 100% 100% 100%  Weight:   64 kg (141 lb)   Height:   5\' 3"  (1.6 m)     Intake/Output Summary (Last 24 hours) at 08/13/2017 1758 Last data filed at 08/13/2017 0930 Gross per 24 hour  Intake 650 ml  Output -  Net 650 ml   Filed Weights   08/12/17 1735 08/13/17 0030  Weight: 61.2 kg (135 lb) 64 kg (141 lb)    Examination:  General exam: Appears calm and comfortable  Respiratory system: Clear to auscultation. Respiratory effort normal. Cardiovascular system: S1 & S2 heard, RRR. No JVD, murmurs, rubs, gallops or clicks. No pedal edema. Gastrointestinal system: Abdomen is nondistended, soft and nontender. No organomegaly or masses felt. Normal bowel sounds heard. Central nervous system: Alert and oriented. No focal neurological deficits. Extremities: Symmetric 5 x 5 power. Skin: Area of erythema and induration over left eyebrow.  This does show some fluctuance. Psychiatry: Judgement and insight appear normal. Mood & affect appropriate.  Data Reviewed: I have personally reviewed following labs and imaging studies  CBC: Recent Labs  Lab 08/12/17 2019 08/13/17 0512  WBC 6.3 6.4  NEUTROABS 3.1  --   HGB 12.4 11.9*  HCT 38.9 37.9  MCV 96.5 96.2  PLT 180 194   Basic Metabolic Panel: Recent Labs  Lab 08/12/17 2019 08/13/17 0512  NA 137 139  K 4.1 4.2  CL 106 107  CO2 23 23  GLUCOSE 99 103*  BUN 13 11  CREATININE 0.86 0.84  CALCIUM 9.2 9.2   GFR: Estimated Creatinine Clearance: 92.1 mL/min (by C-G  formula based on SCr of 0.84 mg/dL). Liver Function Tests: Recent Labs  Lab 08/12/17 2019 08/13/17 0512  AST 156* 164*  ALT 310* 306*  ALKPHOS 66 60  BILITOT 0.4 0.6  PROT 7.8 7.2  ALBUMIN 4.0 3.7   No results for input(s): LIPASE, AMYLASE in the last 168 hours. No results for input(s): AMMONIA in the last 168 hours. Coagulation Profile: No results for input(s): INR, PROTIME in the last 168 hours. Cardiac Enzymes: No results for input(s): CKTOTAL, CKMB, CKMBINDEX, TROPONINI in the last 168 hours. BNP (last 3 results) No results for input(s): PROBNP in the last 8760 hours. HbA1C: No results for input(s): HGBA1C in the last 72 hours. CBG: No results for input(s): GLUCAP in the last 168 hours. Lipid Profile: No results for input(s): CHOL, HDL, LDLCALC, TRIG, CHOLHDL, LDLDIRECT in the last 72 hours. Thyroid Function Tests: No results for input(s): TSH, T4TOTAL, FREET4, T3FREE, THYROIDAB in the last 72 hours. Anemia Panel: No results for input(s): VITAMINB12, FOLATE, FERRITIN, TIBC, IRON, RETICCTPCT in the last 72 hours. Sepsis Labs: No results for input(s): PROCALCITON, LATICACIDVEN in the last 168 hours.  Recent Results (from the past 240 hour(s))  MRSA PCR Screening     Status: None   Collection Time: 08/13/17  1:25 AM  Result Value Ref Range Status   MRSA by PCR NEGATIVE NEGATIVE Final    Comment:        The GeneXpert MRSA Assay (FDA approved for NASAL specimens only), is one component of a comprehensive MRSA colonization surveillance program. It is not intended to diagnose MRSA infection nor to guide or monitor treatment for MRSA infections. Performed at [facility], [address], [place], [place] [address]          Radiology Studies: Ct Maxillofacial W Contrast  Result Date: 08/12/2017 CLINICAL DATA:  Left eye swelling EXAM: CT MAXILLOFACIAL WITH CONTRAST TECHNIQUE: Multidetector CT imaging of the maxillofacial structures was performed with intravenous  contrast. Multiplanar CT image reconstructions were also generated. CONTRAST:  75mL ISOVUE-300 IOPAMIDOL (ISOVUE-300) INJECTION 61% COMPARISON:  None. FINDINGS: Osseous: No facial fracture or focal osseous abnormality. Orbits: There is swelling and induration of the subcutaneous tissues in the left supraorbital zone. No fluid collection. There is no extension into the orbit. The intraorbital structures are normal. The ocular globes are normal. Sinuses: Clear. Soft tissues: Negative. Limited intracranial: No significant or unexpected finding. IMPRESSION: Left supraorbital subcutaneous soft tissue swelling and induration suggesting cellulitis. No orbital involvement. No abscess or drainable fluid collection. Electronically Signed   By: Deatra Robinson M.D.   On: 08/12/2017 21:29        Scheduled Meds: . azithromycin  1 g Oral Once  . enoxaparin (LOVENOX) injection  40 mg Subcutaneous Q24H  . ibuprofen  600 mg Oral Q6H  . [START ON 08/14/2017] Influenza vac split quadrivalent PF  0.5 mL Intramuscular Tomorrow-1000  . [START ON  08/14/2017] pneumococcal 23 valent vaccine  0.5 mL Intramuscular Tomorrow-1000   Continuous Infusions: . sodium chloride    . vancomycin Stopped (08/13/17 0930)     LOS: 0 days    Time spent:    Erick Blinks, MD [facility] Pager [phone]  If 7PM-7AM, please contact night-coverage [email or web address] Password [facility] 08/13/2017, 5:58 PM

## 2017-08-14 ENCOUNTER — Encounter (HOSPITAL_COMMUNITY): Payer: Self-pay

## 2017-08-14 DIAGNOSIS — R945 Abnormal results of liver function studies: Secondary | ICD-10-CM | POA: Diagnosis not present

## 2017-08-14 DIAGNOSIS — H00034 Abscess of left upper eyelid: Secondary | ICD-10-CM | POA: Diagnosis present

## 2017-08-14 DIAGNOSIS — Z23 Encounter for immunization: Secondary | ICD-10-CM | POA: Diagnosis not present

## 2017-08-14 DIAGNOSIS — L03213 Periorbital cellulitis: Secondary | ICD-10-CM | POA: Diagnosis not present

## 2017-08-14 DIAGNOSIS — B182 Chronic viral hepatitis C: Secondary | ICD-10-CM | POA: Diagnosis present

## 2017-08-14 DIAGNOSIS — F17210 Nicotine dependence, cigarettes, uncomplicated: Secondary | ICD-10-CM | POA: Diagnosis present

## 2017-08-14 DIAGNOSIS — Z880 Allergy status to penicillin: Secondary | ICD-10-CM | POA: Diagnosis not present

## 2017-08-14 DIAGNOSIS — N898 Other specified noninflammatory disorders of vagina: Secondary | ICD-10-CM | POA: Diagnosis present

## 2017-08-14 LAB — HEPATITIS PANEL, ACUTE
HCV Ab: >11 s/co ratio — ABNORMAL HIGH (ref 0.0–0.9)
Hep A IgM: NEGATIVE
Hep B C IgM: NEGATIVE
Hepatitis B Surface Ag: NEGATIVE

## 2017-08-14 MED ORDER — SULFAMETHOXAZOLE-TRIMETHOPRIM 800-160 MG PO TABS: 2.0000 | ORAL_TABLET | Freq: Two times a day (BID) | ORAL | 0 refills | Status: AC

## 2017-08-14 MED ORDER — SULFAMETHOXAZOLE-TRIMETHOPRIM 400-80 MG PO TABS
4.0000 | ORAL_TABLET | Freq: Two times a day (BID) | ORAL | Status: DC
  Filled 2017-08-14 (×4): qty 4

## 2017-08-14 MED ORDER — SULFAMETHOXAZOLE-TRIMETHOPRIM 800-160 MG PO TABS
2.0000 | ORAL_TABLET | Freq: Two times a day (BID) | ORAL | Status: DC
  Administered 2017-08-14: 2 via ORAL
  Filled 2017-08-14: qty 2

## 2017-08-14 NOTE — Discharge Summary (Signed)
Physician Discharge Summary  Penny MaresLisa M XXXDavis ZOX:[id number] DOB: 06/12/1991 DOA: 08/12/2017  [facility]  Admit date: 08/12/2017 Discharge date: 08/14/2017  Admitted From:Jail Disposition:  Jail  Recommendations for Outpatient Follow-up:  1. Follow up with PCP in 1-2 weeks     Discharge Condition: Stable CODE STATUS:FULL Diet recommendation: Regular   Brief/Interim Summary: 27 year old female brought to the hospital from correctional facility with cellulitis around her left eye.  It did not lead to any vision changes.  Started on intravenous vancomycin with improvement.  She is also noted to have elevated liver function test.  She has a history of drug use.  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  She will need outpt follow up for possible chronic after release from jail.     Discharge Diagnoses:  1. Cellulitis.  Patient noted to have cellulitis above left eye.  She was prescribed Bactrim as an outpatient and took this for 3-4 days without significant improvement.  CT scan did not show any orbital involvement.  There was no clear collection to drain.  She was started on intravenous vancomycin.  Clinically, she has improved.  Cellulitis appears to be coalescing into collection above left eye.  Will place warm compresses.  discharge with Bactrim DS, 2 tabs bid x 6 more days. 2. Elevated liver function test.  Patient reports being told approximately 2 years ago that she did have elevated liver enzymes.  She does not have any abdominal pain, nausea or vomiting.  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  She will need outpt follow up for possible chronic after release from jail.        Discharge Instructions   Allergies as of 08/14/2017      Reactions   Amoxicillin    unknown   Penicillins Itching, Swelling, Rash   Has patient had a PCN reaction causing immediate rash, facial/tongue/throat  swelling, SOB or lightheadedness with hypotension: Yes Has patient had a PCN reaction causing severe rash involving mucus membranes or skin necrosis: No Has patient had a PCN reaction that required hospitalization: No Has patient had a PCN reaction occurring within the last 10 years: No If all of the above answers are "NO", then may proceed with Cephalosporin use.      Medication List    TAKE these medications   sulfamethoxazole-trimethoprim 800-160 MG tablet Commonly known as:  BACTRIM DS,SEPTRA DS Take 2 tablets by mouth 2 (two) times daily. X 6 days. Take with 10 ounces water each time. What changed:    how much to take  additional instructions       Allergies  Allergen Reactions  . Amoxicillin     unknown  . Penicillins Itching, Swelling and Rash    Has patient had a PCN reaction causing immediate rash, facial/tongue/throat swelling, SOB or lightheadedness with hypotension: Yes Has patient had a PCN reaction causing severe rash involving mucus membranes or skin necrosis: No Has patient had a PCN reaction that required hospitalization: No Has patient had a PCN reaction occurring within the last 10 years: No If all of the above answers are "NO", then may proceed with Cephalosporin use.     Consultations:  none   Procedures/Studies: Ct Maxillofacial W Contrast  Result Date: 08/12/2017 CLINICAL DATA:  Left eye swelling EXAM: CT MAXILLOFACIAL WITH CONTRAST TECHNIQUE: Multidetector CT imaging of the maxillofacial structures was performed with intravenous contrast. Multiplanar CT image reconstructions were  also generated. CONTRAST:  75mL ISOVUE-300 IOPAMIDOL (ISOVUE-300) INJECTION 61% COMPARISON:  None. FINDINGS: Osseous: No facial fracture or focal osseous abnormality. Orbits: There is swelling and induration of the subcutaneous tissues in the left supraorbital zone. No fluid collection. There is no extension into the orbit. The intraorbital structures are normal. The ocular  globes are normal. Sinuses: Clear. Soft tissues: Negative. Limited intracranial: No significant or unexpected finding. IMPRESSION: Left supraorbital subcutaneous soft tissue swelling and induration suggesting cellulitis. No orbital involvement. No abscess or drainable fluid collection. Electronically Signed   By: Deatra Robinson M.D.   On: 08/12/2017 21:29         Discharge Exam: Vitals:   08/13/17 2142 08/14/17 0615  BP: (!) 113/54 (!) 98/57  Pulse: 76 (!) 58  Resp: 20 20  Temp: 98.3 F (36.8 C) 97.6 F (36.4 C)  SpO2: 100% 100%   Vitals:   08/13/17 0559 08/13/17 1832 08/13/17 2142 08/14/17 0615  BP: 108/89 111/68 (!) 113/54 (!) 98/57  Pulse: 83 87 76 (!) 58  Resp: 20 20 20 20   Temp: 97.8 F (36.6 C) 98.1 F (36.7 C) 98.3 F (36.8 C) 97.6 F (36.4 C)  TempSrc: Oral Oral Oral Oral  SpO2: 100% 100% 100% 100%  Weight:      Height:        General: Pt is alert, awake, not in acute distress Cardiovascular: RRR, S1/S2 +, no rubs, no gallops Respiratory: CTA bilaterally, no wheezing, no rhonchi Abdominal: Soft, NT, ND, bowel sounds + Extremities: no edema, no cyanosis   The results of significant diagnostics from this hospitalization (including imaging, microbiology, ancillary and laboratory) are listed below for reference.    Significant Diagnostic Studies: Ct Maxillofacial W Contrast  Result Date: 08/12/2017 CLINICAL DATA:  Left eye swelling EXAM: CT MAXILLOFACIAL WITH CONTRAST TECHNIQUE: Multidetector CT imaging of the maxillofacial structures was performed with intravenous contrast. Multiplanar CT image reconstructions were also generated. CONTRAST:  75mL ISOVUE-300 IOPAMIDOL (ISOVUE-300) INJECTION 61% COMPARISON:  None. FINDINGS: Osseous: No facial fracture or focal osseous abnormality. Orbits: There is swelling and induration of the subcutaneous tissues in the left supraorbital zone. No fluid collection. There is no extension into the orbit. The intraorbital structures are  normal. The ocular globes are normal. Sinuses: Clear. Soft tissues: Negative. Limited intracranial: No significant or unexpected finding. IMPRESSION: Left supraorbital subcutaneous soft tissue swelling and induration suggesting cellulitis. No orbital involvement. No abscess or drainable fluid collection. Electronically Signed   By: Deatra Robinson M.D.   On: 08/12/2017 21:29     Microbiology: Recent Results (from the past 240 hour(s))  MRSA PCR Screening     Status: None   Collection Time: 08/13/17  1:25 AM  Result Value Ref Range Status   MRSA by PCR NEGATIVE NEGATIVE Final    Comment:        The GeneXpert MRSA Assay (FDA approved for NASAL specimens only), is one component of a comprehensive MRSA colonization surveillance program. It is not intended to diagnose MRSA infection nor to guide or monitor treatment for MRSA infections. Performed at [facility], [address] [place], [place] [address]      Labs: Basic Metabolic Panel: Recent Labs  Lab 08/12/17 2019 08/13/17 0512 08/13/17 2004  NA 137 139 138  K 4.1 4.2 4.5  CL 106 107 106  CO2 23 23 25   GLUCOSE 99 103* 95  BUN 13 11 10   CREATININE 0.86 0.84 0.88  CALCIUM 9.2 9.2 9.2   Liver Function Tests:  Recent Labs  Lab 08/12/17 2019 08/13/17 0512 08/13/17 2004  AST 156* 164* 165*  ALT 310* 306* 327*  ALKPHOS 66 60 64  BILITOT 0.4 0.6 0.4  PROT 7.8 7.2 7.5  ALBUMIN 4.0 3.7 3.8   No results for input(s): LIPASE, AMYLASE in the last 168 hours. No results for input(s): AMMONIA in the last 168 hours. CBC: Recent Labs  Lab 08/12/17 2019 08/13/17 0512  WBC 6.3 6.4  NEUTROABS 3.1  --   HGB 12.4 11.9*  HCT 38.9 37.9  MCV 96.5 96.2  PLT 180 194   Cardiac Enzymes: No results for input(s): CKTOTAL, CKMB, CKMBINDEX, TROPONINI in the last 168 hours. BNP: Invalid input(s): POCBNP CBG: No results for input(s): GLUCAP in the last 168 hours.  Time coordinating discharge:  Greater than 30  minutes  Signed:  Catarina Hartshorn, DO [facility] Pager: [phone] 08/14/2017, 5:26 PM

## 2017-08-14 NOTE — Progress Notes (Signed)
Patient verbalized concern of left eyelid blister draining discharge. Upon visual inspection, no drainage observed. Tender to touch and soft. Warm compresses offered with minimal relief per patient.

## 2017-08-14 NOTE — Progress Notes (Signed)
IV removed, WNL. D/C instructions given to pt. Verbalized understanding. Guard at bedside to transport patient out and back to jail.

## 2017-12-09 ENCOUNTER — Encounter: Payer: Self-pay | Admitting: Women's Health

## 2019-01-13 IMAGING — CT CT MAXILLOFACIAL W/ CM
3 series · 15 of 47 positions shown, 18 images · IV contrast (iopamidol)
Comparison: None.

CLINICAL DATA: Left eye swelling

EXAM:
CT MAXILLOFACIAL WITH CONTRAST
TECHNIQUE: Multidetector CT imaging of the maxillofacial structures was
performed with intravenous contrast. Multiplanar CT image
reconstructions were also generated.
CONTRAST:  75mL 4YXCN0-0MM IOPAMIDOL (4YXCN0-0MM) INJECTION 61%

[Series 2: max soft · axial · 0.33mm/px · z∈[-708,-578]mm · 9 of 77 slices shown, 12 images]
[im 6/77  brain]
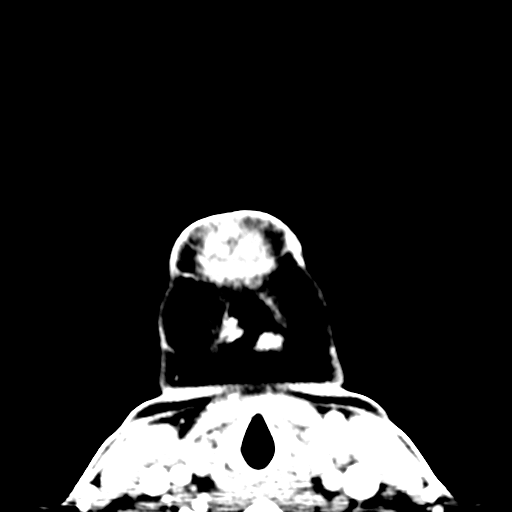
[im 6/77  bone]
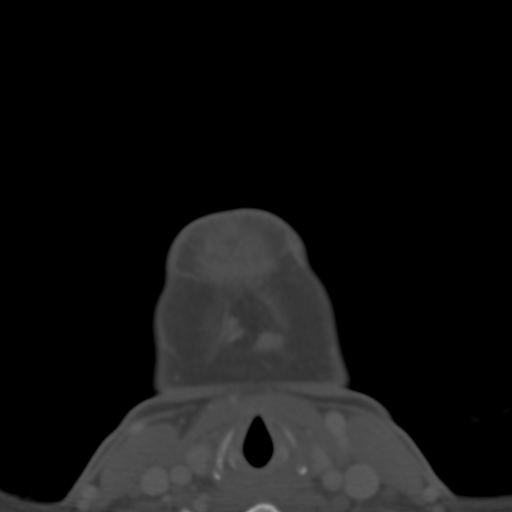
[im 14/77  bone]
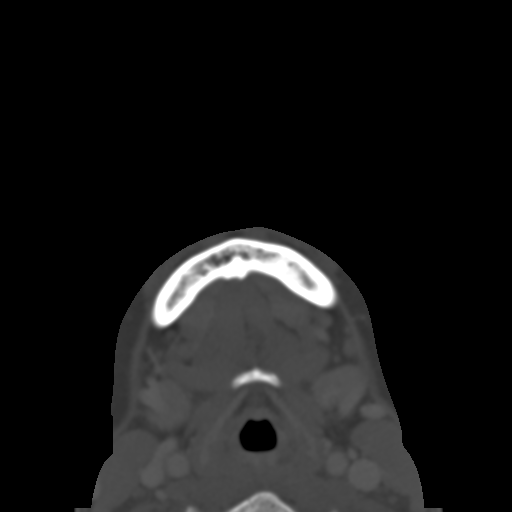
[im 21/77  bone]
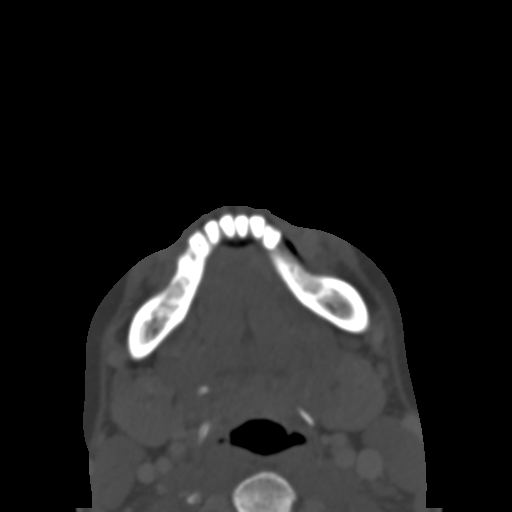
[im 29/77  bone]
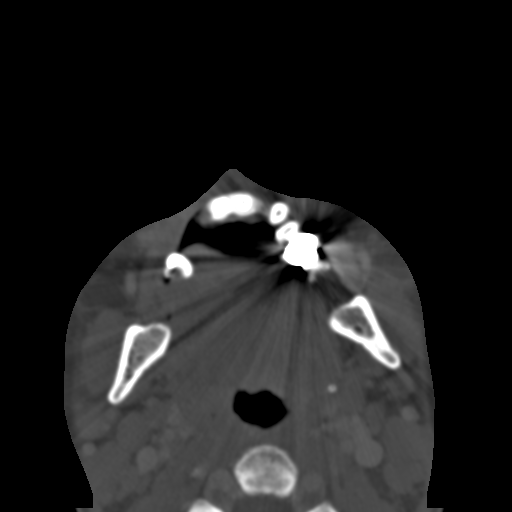
[im 40/77  brain]
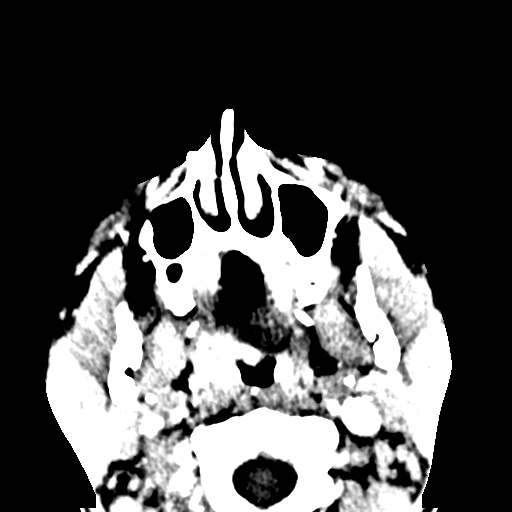
[im 40/77  bone]
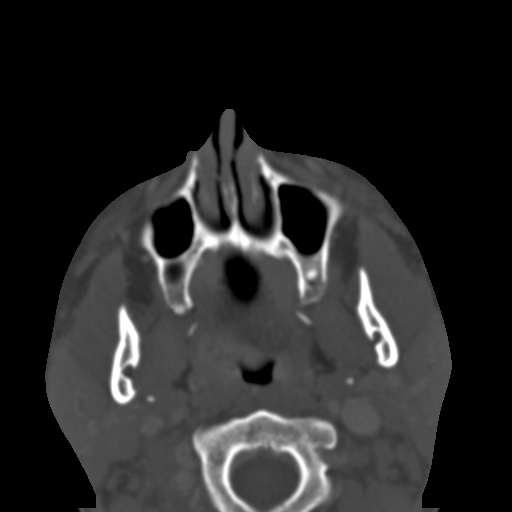
[im 48/77  bone]
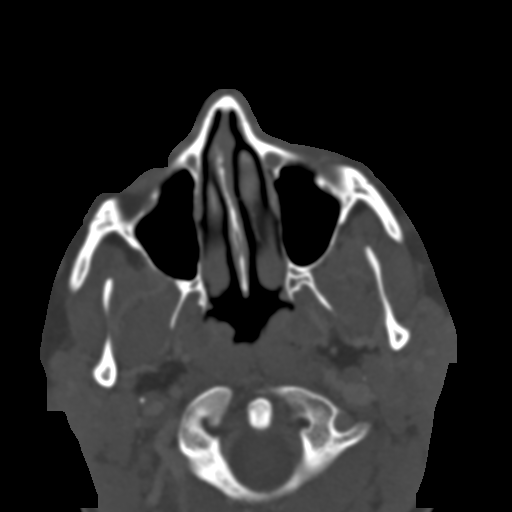
[im 56/77  bone]
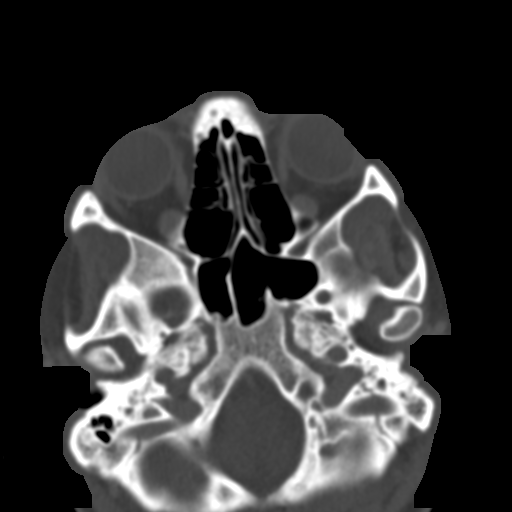
[im 63/77  bone]
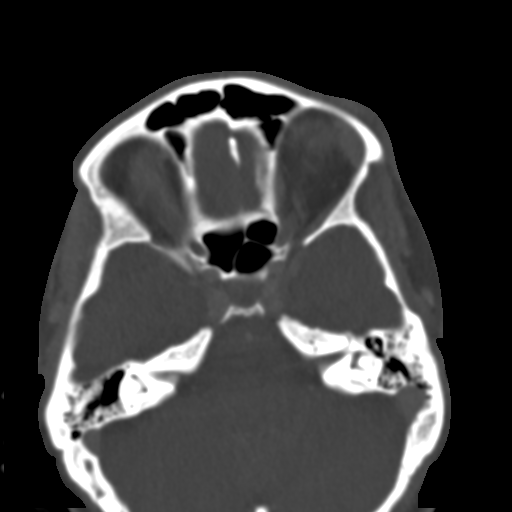
[im 71/77  brain]
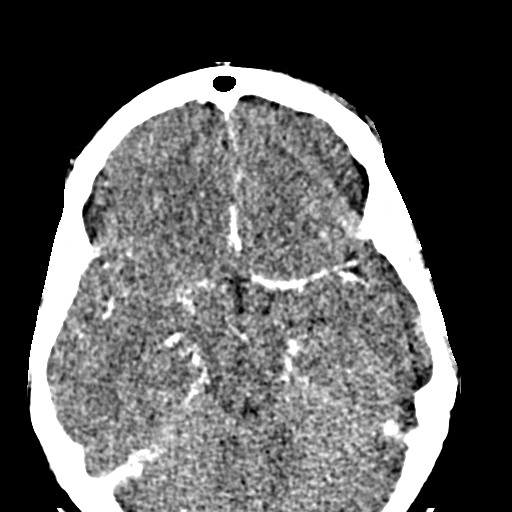
[im 71/77  bone]
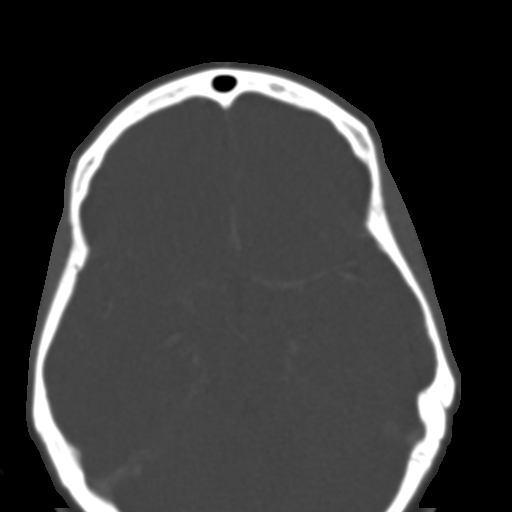

[Series 4: coronal soft · coronal · 0.30mm/px · 3 of 67 slices shown]
[im 23/67  bone]
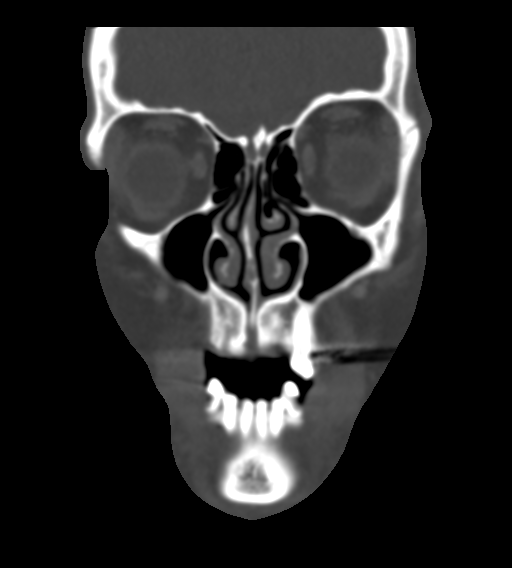
[im 30/67  bone]
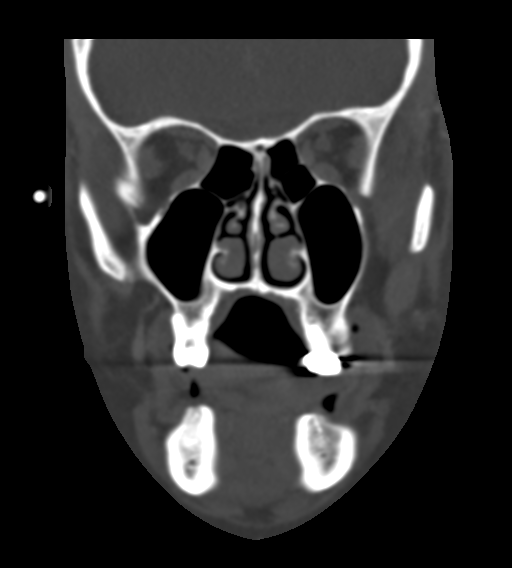
[im 37/67  bone]
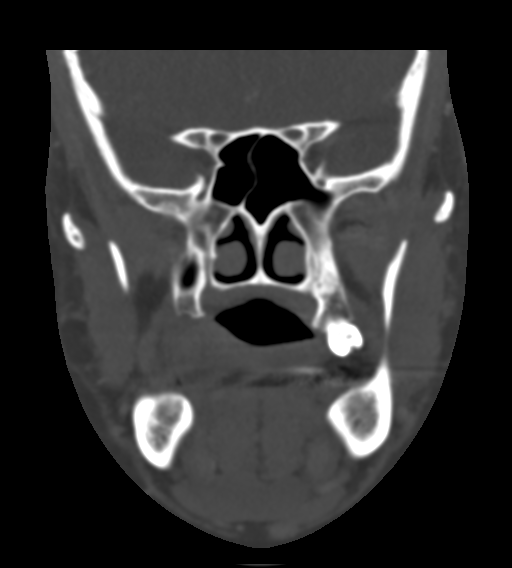

[Series 5: sagittal soft · sagittal · 0.26mm/px · 3 of 76 slices shown]
[im 26/76  bone]
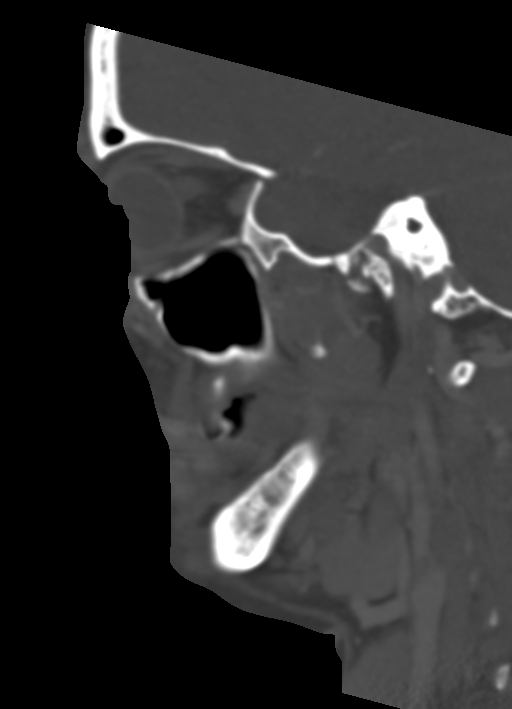
[im 38/76  bone]
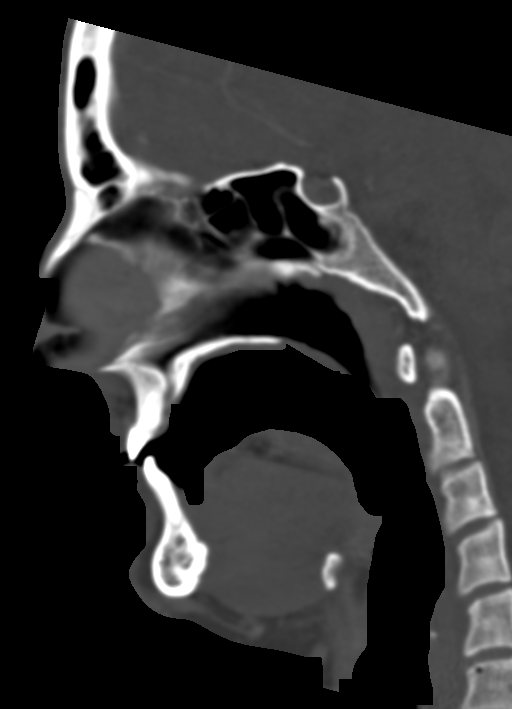
[im 51/76  bone]
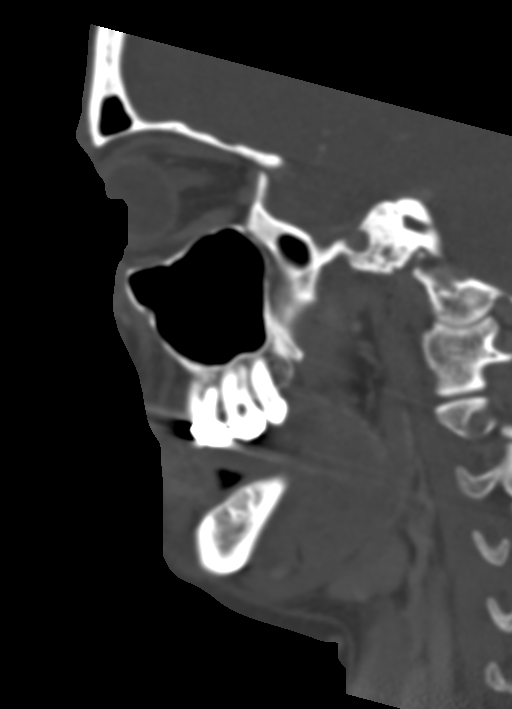

[15 of 47 positions shown; findings below may reference images not displayed]

FINDINGS: Osseous: No facial fracture or focal osseous abnormality.

Orbits: There is swelling and induration of the subcutaneous tissues
in the left supraorbital zone. No fluid collection. There is no
extension into the orbit. The intraorbital structures are normal.
The ocular globes are normal.

Sinuses: Clear.

Soft tissues: Negative.

Limited intracranial: No significant or unexpected finding.
IMPRESSION: Left supraorbital subcutaneous soft tissue swelling and induration
suggesting cellulitis. No orbital involvement. No abscess or
drainable fluid collection.

## 2019-10-22 ENCOUNTER — Emergency Department (HOSPITAL_COMMUNITY)
Admission: EM | Admit: 2019-10-22 | Discharge: 2019-10-23 | Disposition: A | Discharge status: Home / Self Care | Payer: Medicaid Other | Attending: Emergency Medicine | Admitting: Emergency Medicine

## 2019-10-22 ENCOUNTER — Encounter (HOSPITAL_COMMUNITY): Payer: Self-pay | Admitting: *Deleted

## 2019-10-22 ENCOUNTER — Other Ambulatory Visit: Payer: Self-pay

## 2019-10-22 DIAGNOSIS — M545 Low back pain: Secondary | ICD-10-CM | POA: Insufficient documentation

## 2019-10-22 DIAGNOSIS — Z5321 Procedure and treatment not carried out due to patient leaving prior to being seen by health care provider: Secondary | ICD-10-CM | POA: Diagnosis not present

## 2019-10-22 HISTORY — DX: Other chronic pain: G89.29

## 2019-10-22 NOTE — ED Triage Notes (Signed)
Pt arrives with c/o right lower back pain since having her baby at the end of December. Says pain has been on and off. Seen in [place] for the same, reports xray was negative.

## 2019-10-23 NOTE — ED Notes (Signed)
Called for pt with no response. 

## 2020-02-24 ENCOUNTER — Encounter: Payer: Self-pay | Admitting: Internal Medicine

## 2020-04-13 ENCOUNTER — Encounter: Payer: Self-pay | Admitting: Internal Medicine

## 2020-04-13 ENCOUNTER — Ambulatory Visit: Payer: Medicaid Other | Admitting: Gastroenterology

## 2020-10-20 ENCOUNTER — Other Ambulatory Visit: Payer: Self-pay

## 2020-10-20 ENCOUNTER — Emergency Department (HOSPITAL_COMMUNITY): Payer: Medicaid Other

## 2020-10-20 ENCOUNTER — Encounter (HOSPITAL_COMMUNITY): Payer: Self-pay

## 2020-10-20 ENCOUNTER — Emergency Department (HOSPITAL_COMMUNITY)
Admission: EM | Admit: 2020-10-20 | Discharge: 2020-10-20 | Disposition: A | Discharge status: Home / Self Care | Payer: Medicaid Other | Attending: Emergency Medicine | Admitting: Emergency Medicine

## 2020-10-20 DIAGNOSIS — R300 Dysuria: Secondary | ICD-10-CM | POA: Insufficient documentation

## 2020-10-20 DIAGNOSIS — R350 Frequency of micturition: Secondary | ICD-10-CM | POA: Insufficient documentation

## 2020-10-20 DIAGNOSIS — J45909 Unspecified asthma, uncomplicated: Secondary | ICD-10-CM | POA: Diagnosis not present

## 2020-10-20 DIAGNOSIS — F17210 Nicotine dependence, cigarettes, uncomplicated: Secondary | ICD-10-CM | POA: Diagnosis not present

## 2020-10-20 DIAGNOSIS — R0789 Other chest pain: Secondary | ICD-10-CM | POA: Diagnosis present

## 2020-10-20 LAB — CBC WITH DIFFERENTIAL/PLATELET
Abs Immature Granulocytes: 0.03 10*3/uL (ref 0.00–0.07)
Basophils Absolute: 0 10*3/uL (ref 0.0–0.1)
Basophils Relative: 0 %
Eosinophils Absolute: 0.3 10*3/uL (ref 0.0–0.5)
Eosinophils Relative: 2 %
HCT: 39 % (ref 36.0–46.0)
Hemoglobin: 12.8 g/dL (ref 12.0–15.0)
Immature Granulocytes: 0 %
Lymphocytes Relative: 23 %
Lymphs Abs: 2.6 10*3/uL (ref 0.7–4.0)
MCH: 30.3 pg (ref 26.0–34.0)
MCHC: 32.8 g/dL (ref 30.0–36.0)
MCV: 92.2 fL (ref 80.0–100.0)
Monocytes Absolute: 0.8 10*3/uL (ref 0.1–1.0)
Monocytes Relative: 7 %
Neutro Abs: 7.6 10*3/uL (ref 1.7–7.7)
Neutrophils Relative %: 68 %
Platelets: 233 10*3/uL (ref 150–400)
RBC: 4.23 MIL/uL (ref 3.87–5.11)
RDW: 13.2 % (ref 11.5–15.5)
WBC: 11.3 10*3/uL — ABNORMAL HIGH (ref 4.0–10.5)
nRBC: 0 % (ref 0.0–0.2)

## 2020-10-20 LAB — COMPREHENSIVE METABOLIC PANEL
ALT: 15 U/L (ref 0–44)
AST: 19 U/L (ref 15–41)
Albumin: 4.1 g/dL (ref 3.5–5.0)
Alkaline Phosphatase: 65 U/L (ref 38–126)
Anion gap: 10 (ref 5–15)
BUN: 12 mg/dL (ref 6–20)
CO2: 26 mmol/L (ref 22–32)
Calcium: 9.5 mg/dL (ref 8.9–10.3)
Chloride: 105 mmol/L (ref 98–111)
Creatinine, Ser: 0.92 mg/dL (ref 0.44–1.00)
GFR, Estimated: >60 mL/min (ref >60)
Glucose, Bld: 94 mg/dL (ref 70–99)
Potassium: 3.4 mmol/L — ABNORMAL LOW (ref 3.5–5.1)
Sodium: 141 mmol/L (ref 135–145)
Total Bilirubin: 0.9 mg/dL (ref 0.3–1.2)
Total Protein: 7.4 g/dL (ref 6.5–8.1)

## 2020-10-20 LAB — I-STAT BETA HCG BLOOD, ED (MC, WL, AP ONLY): I-stat hCG, quantitative: <5 m[IU]/mL (ref <5)

## 2020-10-20 LAB — LIPASE, BLOOD: Lipase: 26 U/L (ref 11–51)

## 2020-10-20 LAB — D-DIMER, QUANTITATIVE: D-Dimer, Quant: 0.96 ug/mL-FEU — ABNORMAL HIGH (ref 0.00–0.50)

## 2020-10-20 LAB — TROPONIN I (HIGH SENSITIVITY)
Troponin I (High Sensitivity): <2 ng/L (ref <18)
Troponin I (High Sensitivity): <2 ng/L (ref <18)

## 2020-10-20 MED ORDER — IOHEXOL 350 MG/ML SOLN
80.0000 mL | Freq: Once | INTRAVENOUS | Status: AC | PRN
  Administered 2020-10-20: 80 mL via INTRAVENOUS

## 2020-10-20 MED ORDER — LORAZEPAM 1 MG PO TABS
1.0000 mg | ORAL_TABLET | Freq: Once | ORAL | Status: AC
  Administered 2020-10-20: 1 mg via ORAL
  Filled 2020-10-20: qty 1

## 2020-10-20 MED ORDER — SODIUM CHLORIDE 0.9 % IV BOLUS
1000.0000 mL | Freq: Once | INTRAVENOUS | Status: AC
  Administered 2020-10-20: 1000 mL via INTRAVENOUS

## 2020-10-20 MED ORDER — HYDROXYZINE HCL 25 MG PO TABS: 25.0000 mg | ORAL_TABLET | Freq: Three times a day (TID) | ORAL | 0 refills | Status: AC | PRN

## 2020-10-20 NOTE — ED Triage Notes (Signed)
Pt presents to ED with complaints of squeezing mid chest pain started last night, gets worse when she breathes out

## 2020-10-20 NOTE — ED Notes (Signed)
Patient to CT at this time

## 2020-10-20 NOTE — Discharge Instructions (Addendum)
Use hydroxyzine as needed for anxiety. Follow closely department care doctor for recheck of symptoms. Return to the emergency room with any new commotion, concerning symptoms

## 2020-10-20 NOTE — ED Provider Notes (Signed)
[facility] EMERGENCY DEPARTMENT Provider Note   CSN: [id number] Arrival date & time: 10/20/20  2800     History Chief Complaint  Patient presents with  . Chest Pain    Penny Douglas is a 30 y.o. female presenting for evaluation of chest pain.  Patient states yesterday she developed acute onset pain.  It began in her left lateral side and radiated to her chest and has stayed there since.  She reports it is a squeezing severe pain.  It is worse with inspiration.  She denies history of similar.  She denies associated fevers, cough, shortness of breath, nausea, vomiting abdominal pain, abnormal bowel movements.  She has not taken anything for pain.  She is not still having side pain.  She reports tobacco and weed use.  Denies alcohol or other drug use.  She denies recent travel, surgeries, mobilization, history of cancer, history previous DVT/PE, or hormone use.  She does have a history of asthma, does not use her inhaler every day.  She has not used it symptoms since her symptoms began.  She does report a history of anxiety, is not on anything for this. Additionally, patient states for the past few days she has had dysuria and urinary frequency/urgency.  No hematuria  Additional history obtained from chart review.  Patient with a history of chronic pain.  She was seen at urgent care earlier today, was tachycardic and sent to the ER for further evaluation.   HPI     Past Medical History:  Diagnosis Date  . Chronic back pain   . Pregnant     Patient Active Problem List   Diagnosis Date Noted  . Preseptal cellulitis of left eye   . Abnormal LFTs 08/13/2017  . Cellulitis 08/12/2017    History reviewed. No pertinent surgical history.   OB History    Gravida  1   Para      Term      Preterm      AB      Living        SAB      IAB      Ectopic      Multiple      Live Births              No family history on file.  Social History   Tobacco Use  .  Smoking status: Current Every Day Smoker    Packs/day: 1.00    Types: Cigarettes  . Smokeless tobacco: Never Used  Substance Use Topics  . Alcohol use: No  . Drug use: Yes    Types: Marijuana    Home Medications Prior to Admission medications   Medication Sig Start Date End Date Taking? Authorizing Provider  hydrOXYzine (ATARAX/VISTARIL) 25 MG tablet Take 1 tablet (25 mg total) by mouth every 8 (eight) hours as needed. 10/20/20  Yes Jennier Schissler, PA-C  sulfamethoxazole-trimethoprim (BACTRIM DS,SEPTRA DS) 800-160 MG tablet Take 2 tablets by mouth 2 (two) times daily. X 6 days. Take with 10 ounces water each time. Patient not taking: Reported on 10/20/2020 08/14/17   Catarina Hartshorn, MD    Allergies    Amoxicillin and Penicillins  Review of Systems   Review of Systems  Cardiovascular: Positive for chest pain.  Genitourinary: Positive for dysuria, frequency and urgency.  All other systems reviewed and are negative.   Physical Exam Updated Vital Signs BP 96/76   Pulse 73   Temp 98.2 F (36.8 C) (Oral)  Resp (!) 21   Ht 5\' 4"  (1.626 m)   Wt 56.7 kg   LMP 09/22/2020   SpO2 100%   BMI 21.46 kg/m   Physical Exam Vitals and nursing note reviewed.  Constitutional:      General: She is not in acute distress.    Appearance: She is well-developed.     Comments: Appears nontoxic  HENT:     Head: Normocephalic and atraumatic.     Mouth/Throat:     Dentition: Abnormal dentition. Dental caries present.  Eyes:     Extraocular Movements: Extraocular movements intact.     Conjunctiva/sclera: Conjunctivae normal.     Pupils: Pupils are equal, round, and reactive to light.  Cardiovascular:     Rate and Rhythm: Normal rate and regular rhythm.     Pulses: Normal pulses.     Comments: HR between 95 and 110 on my evaluation  Pulmonary:     Effort: Pulmonary effort is normal. No respiratory distress.     Breath sounds: Normal breath sounds. No wheezing.  Abdominal:     General:  There is no distension.     Palpations: Abdomen is soft. There is no mass.     Tenderness: There is no abdominal tenderness. There is no guarding or rebound.  Musculoskeletal:        General: Normal range of motion.     Cervical back: Normal range of motion and neck supple.  Skin:    General: Skin is warm and dry.     Capillary Refill: Capillary refill takes less than 2 seconds.  Neurological:     Mental Status: She is alert and oriented to person, place, and time.     ED Results / Procedures / Treatments   Labs (all labs ordered are listed, but only abnormal results are displayed) Labs Reviewed  CBC WITH DIFFERENTIAL/PLATELET - Abnormal; Notable for the following components:      Result Value   WBC 11.3 (*)    All other components within normal limits  COMPREHENSIVE METABOLIC PANEL - Abnormal; Notable for the following components:   Potassium 3.4 (*)    All other components within normal limits  D-DIMER, QUANTITATIVE - Abnormal; Notable for the following components:   D-Dimer, Quant 0.96 (*)    All other components within normal limits  LIPASE, BLOOD  URINALYSIS, ROUTINE W REFLEX MICROSCOPIC  PREGNANCY, URINE  I-STAT BETA HCG BLOOD, ED ([facility], [facility], [facility] ONLY)  I-STAT BETA HCG BLOOD, ED ([facility], [facility], [facility] ONLY)  TROPONIN I (HIGH SENSITIVITY)  TROPONIN I (HIGH SENSITIVITY)    EKG EKG Interpretation  Date/Time:  Thursday October 20 2020 10:06:06 EDT Ventricular Rate:  88 PR Interval:  164 QRS Duration: 102 QT Interval:  356 QTC Calculation: 431 R Axis:   90 Text Interpretation: Sinus rhythm Probable left atrial enlargement Borderline right axis deviation Borderline Q waves in inferior leads Artifact in lead(s) III V3 No old tracing to compare Confirmed by 1984 [phone]) on 10/20/2020 10:10:06 AM   Radiology DG Chest 2 View  Result Date: 10/20/2020 CLINICAL DATA:  Chest pain EXAM: CHEST - 2 VIEW COMPARISON:  12/27/2013 FINDINGS: Artifact from EKG leads. Normal heart size and  mediastinal contours. No acute infiltrate or edema. No effusion or pneumothorax. No acute osseous findings. IMPRESSION: No evidence of active disease. Electronically Signed   By: 12/29/2013 M.D.   On: 10/20/2020 11:20   CT Angio Chest PE W and/or Wo Contrast  Result Date: 10/20/2020 CLINICAL DATA:  Chest pain. EXAM: CT ANGIOGRAPHY CHEST WITH CONTRAST TECHNIQUE: Multidetector CT imaging of the chest was performed using the standard protocol during bolus administration of intravenous contrast. Multiplanar CT image reconstructions and MIPs were obtained to evaluate the vascular anatomy. CONTRAST:  48mL OMNIPAQUE IOHEXOL 350 MG/ML SOLN COMPARISON:  None. FINDINGS: Cardiovascular: Satisfactory opacification of the pulmonary arteries to the segmental level. No evidence of pulmonary embolism. Normal heart size. No pericardial effusion. Mediastinum/Nodes: Residual thymic tissue is noted. Thyroid gland and esophagus are unremarkable. Lungs/Pleura: Lungs are clear. No pleural effusion or pneumothorax. Upper Abdomen: No acute abnormality. Musculoskeletal: No chest wall abnormality. No acute or significant osseous findings. Review of the MIP images confirms the above findings. IMPRESSION: No definite evidence of pulmonary embolus. No definite acute abnormality seen in the chest. Electronically Signed   By: Lupita Raider M.D.   On: 10/20/2020 15:58    Procedures Procedures   Medications Ordered in ED Medications  LORazepam (ATIVAN) tablet 1 mg (1 mg Oral Given 10/20/20 1033)  sodium chloride 0.9 % bolus 1,000 mL (1,000 mLs Intravenous New Bag/Given 10/20/20 1505)  iohexol (OMNIPAQUE) 350 MG/ML injection 80 mL (80 mLs Intravenous Contrast Given 10/20/20 1521)    ED Course  I have reviewed the triage vital signs and the nursing notes.  Pertinent labs & imaging results that were available during my care of the patient were reviewed by me and considered in my medical decision making (see chart for  details).    MDM Rules/Calculators/A&P                          Patient presented for evaluation of chest pain.  On exam, patient appears nontoxic.  She does have variable heart rate between 95 and 110.  She does appear very anxious, this could be contributing to her symptoms.  In the setting of episodes of tachycardia with pleuritic pain, consider PE although less likely as patient is without risk factors.  Will obtain D-dimer.  Less likely ACS, however due to her new chest pain, will obtain labs and EKG.  Consider GI cause including gastritis/GERD/PUD.  Consider anxiety.  Consider MSK pain.  EKG nonischemic.  Troponin negative.  Electrolytes stable.  Mild nonspecific leukocytosis, without fever.  No infection.  Chest x-ray viewed interpreted by me, no pneumonia pneumothorax or effusion.  Unfortunately patient's D-dimer is minimally elevated at 0.96.  Obtain CTA.  CTA negative for PE.  Discussed findings with patient.  On reassessment after Ativan, patient reports significant improvement symptoms.  I discussed possible anxiety as cause for her symptoms.  Will discharge on Atarax and encourage close follow-up with PCP.  At this time, patient appears safe for discharge.  Return precautions given.  Patient states she understands and agrees to plan.  Final Clinical Impression(s) / ED Diagnoses Final diagnoses:  Atypical chest pain    Rx / DC Orders ED Discharge Orders         Ordered    hydrOXYzine (ATARAX/VISTARIL) 25 MG tablet  Every 8 hours PRN        10/20/20 1646           Darrik Richman, PA-C 10/20/20 1651    Mancel Bale, MD 10/21/20 [phone]

## 2020-10-20 NOTE — ED Notes (Signed)
Patient to xray at this time

## 2022-03-23 IMAGING — DX DG CHEST 2V
2 series · 2 of 2 positions shown · non-contrast
Comparison: 12/27/2013

CLINICAL DATA: Chest pain

EXAM:
CHEST - 2 VIEW

[chest pa]
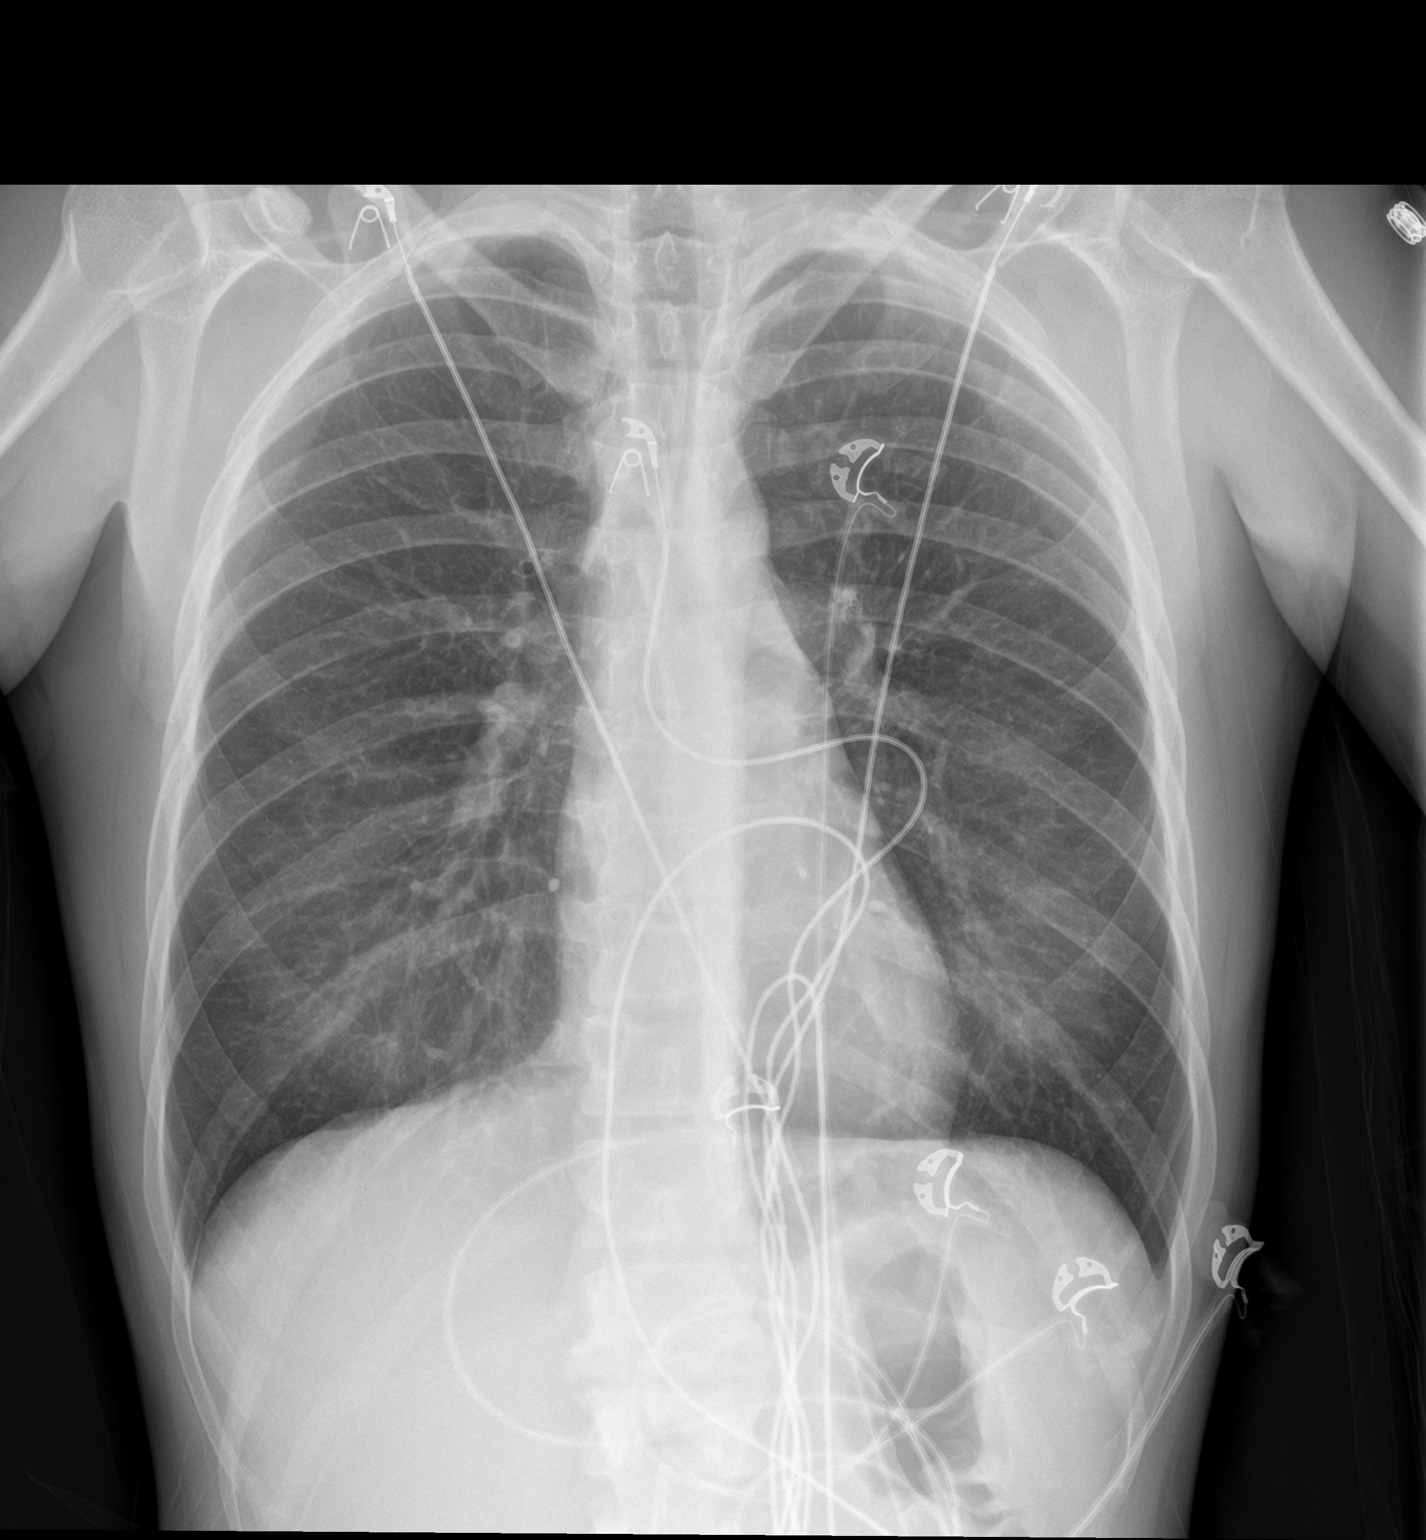

[chest lat]
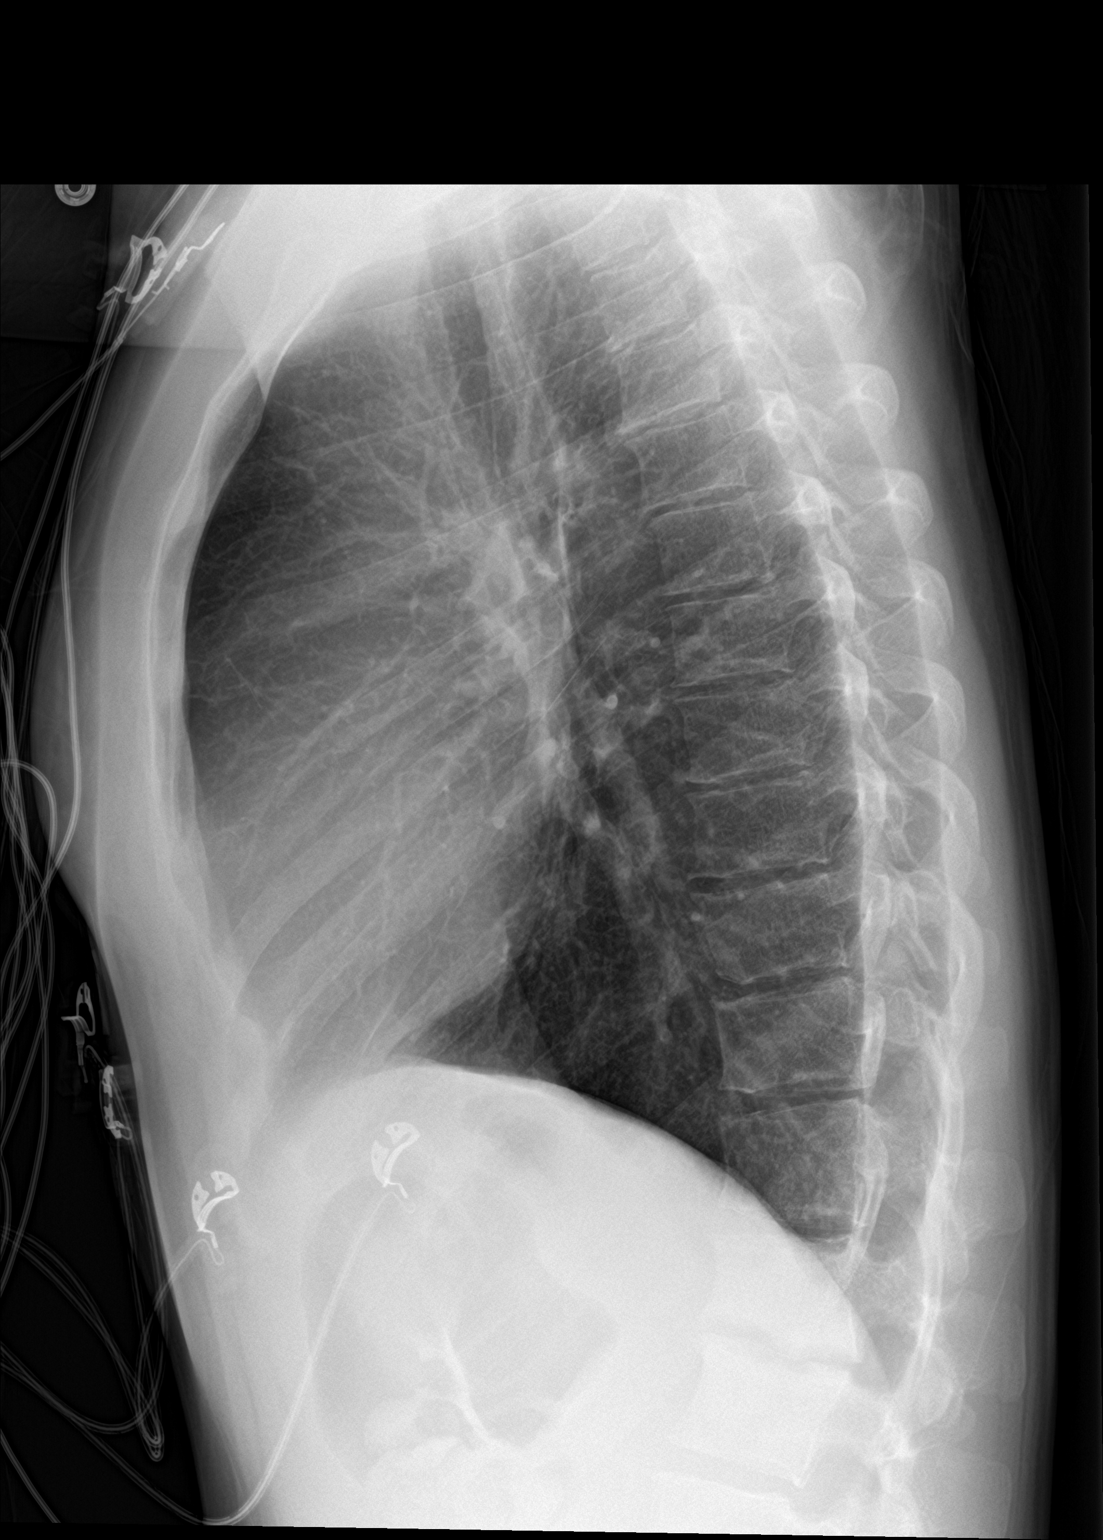

[2 of 2 positions shown; findings below may reference images not displayed]

FINDINGS: Artifact from EKG leads.

Normal heart size and mediastinal contours. No acute infiltrate or
edema. No effusion or pneumothorax. No acute osseous findings.
IMPRESSION: No evidence of active disease.
